# Patient Record
Sex: Female | Born: 1955 | Race: White | Hispanic: No | Smoking: Former smoker
Health system: Southern US, Community
[De-identification: ages and names within clinical notes are randomized; demographics above are authoritative.]

## PROBLEM LIST (undated history)

## (undated) DIAGNOSIS — T7840XA Allergy, unspecified, initial encounter: Secondary | ICD-10-CM

## (undated) DIAGNOSIS — E039 Hypothyroidism, unspecified: Secondary | ICD-10-CM

## (undated) DIAGNOSIS — Z6838 Body mass index (BMI) 38.0-38.9, adult: Secondary | ICD-10-CM

## (undated) DIAGNOSIS — D649 Anemia, unspecified: Secondary | ICD-10-CM

## (undated) HISTORY — DX: Body mass index (BMI) 38.0-38.9, adult: Z68.38

## (undated) HISTORY — PX: EYE SURGERY: SHX253

## (undated) HISTORY — PX: TONSILLECTOMY: SUR1361

## (undated) HISTORY — PX: APPENDECTOMY: SHX54

## (undated) HISTORY — PX: TUBAL LIGATION: SHX77

## (undated) HISTORY — DX: Allergy, unspecified, initial encounter: T78.40XA

## (undated) HISTORY — PX: CERVICAL CONE BIOPSY: SUR198

---

## 2012-02-01 ENCOUNTER — Encounter (INDEPENDENT_AMBULATORY_CARE_PROVIDER_SITE_OTHER): Payer: BC Managed Care – PPO | Admitting: Ophthalmology

## 2012-02-01 DIAGNOSIS — H43819 Vitreous degeneration, unspecified eye: Secondary | ICD-10-CM

## 2012-02-01 DIAGNOSIS — H2510 Age-related nuclear cataract, unspecified eye: Secondary | ICD-10-CM

## 2012-02-01 DIAGNOSIS — H35349 Macular cyst, hole, or pseudohole, unspecified eye: Secondary | ICD-10-CM

## 2012-02-02 ENCOUNTER — Other Ambulatory Visit (INDEPENDENT_AMBULATORY_CARE_PROVIDER_SITE_OTHER): Payer: Self-pay | Admitting: Ophthalmology

## 2012-02-02 DIAGNOSIS — H35349 Macular cyst, hole, or pseudohole, unspecified eye: Secondary | ICD-10-CM

## 2012-02-02 NOTE — H&P (Signed)
Taylor Berg is an 56 y.o. female.   Chief Complaint:Loss of vision right eye  HPI: Loss of vision right eye 3 months ago  Med history:  Negative for MI,  Negative for HIV and Hep  Surg Hx  Appendectomy at age 66 [facility] biopsy 2007   No family history on file. Social History:  does not have a smoking history on file. She does not have any smokeless tobacco history on file. Her alcohol and drug histories not on file.  Allergies: Allergies not on file  No current facility-administered medications on file as of .   No current outpatient prescriptions on file as of .    Review of systems otherwise negative  There were no vitals taken for this visit.  Physical exam: Mental status: oriented x3. Eyes: See eye exam associated with this surgery on this date of service.  Scanned in by scanning center.  Look in media Ears, Nose, Throat: within normal limits Neck: Within Normal limits General: within normal limits Chest: Within normal limits Breast: deferred Heart: Within normal limits Abdomen: Within normal limits GU: deferred Extremities: within normal limits Skin: within normal limits  Assessment/Plan Macular hole right eye Plan: To [facility] for pars plana vitrectomy with laser, gas and serum patch Right eye  Taylor Berg, Taylor Berg 02/02/2012, 8:03 AM

## 2012-02-03 ENCOUNTER — Encounter (HOSPITAL_COMMUNITY): Payer: Self-pay | Admitting: Pharmacy Technician

## 2012-02-03 ENCOUNTER — Other Ambulatory Visit (INDEPENDENT_AMBULATORY_CARE_PROVIDER_SITE_OTHER): Payer: Self-pay | Admitting: Ophthalmology

## 2012-02-15 ENCOUNTER — Inpatient Hospital Stay (INDEPENDENT_AMBULATORY_CARE_PROVIDER_SITE_OTHER): Payer: BC Managed Care – PPO | Admitting: Ophthalmology

## 2012-02-15 ENCOUNTER — Encounter (HOSPITAL_COMMUNITY): Payer: Self-pay

## 2012-02-15 MED ORDER — GATIFLOXACIN 0.5 % OP SOLN
1.0000 [drp] | OPHTHALMIC | Status: AC | PRN
  Administered 2012-02-16 (×3): 1 [drp] via OPHTHALMIC
  Filled 2012-02-15: qty 2.5

## 2012-02-15 MED ORDER — CYCLOPENTOLATE HCL 1 % OP SOLN
1.0000 [drp] | OPHTHALMIC | Status: AC | PRN
  Administered 2012-02-16 (×3): 1 [drp] via OPHTHALMIC
  Filled 2012-02-15: qty 2

## 2012-02-15 MED ORDER — PHENYLEPHRINE HCL 2.5 % OP SOLN
1.0000 [drp] | OPHTHALMIC | Status: AC | PRN
  Administered 2012-02-16 (×3): 1 [drp] via OPHTHALMIC
  Filled 2012-02-15: qty 3

## 2012-02-15 MED ORDER — TROPICAMIDE 1 % OP SOLN
1.0000 [drp] | OPHTHALMIC | Status: AC | PRN
  Administered 2012-02-16 (×3): 1 [drp] via OPHTHALMIC
  Filled 2012-02-15: qty 3

## 2012-02-15 MED ORDER — CLINDAMYCIN PHOSPHATE 600 MG/50ML IV SOLN
600.0000 mg | INTRAVENOUS | Status: AC
  Administered 2012-02-16: 600 mg via INTRAVENOUS
  Filled 2012-02-15: qty 50

## 2012-02-16 ENCOUNTER — Encounter (HOSPITAL_COMMUNITY): Payer: Self-pay | Admitting: *Deleted

## 2012-02-16 ENCOUNTER — Other Ambulatory Visit (HOSPITAL_COMMUNITY): Payer: Self-pay | Admitting: Ophthalmology

## 2012-02-16 ENCOUNTER — Encounter (HOSPITAL_COMMUNITY): Payer: Self-pay | Admitting: Anesthesiology

## 2012-02-16 ENCOUNTER — Ambulatory Visit (HOSPITAL_COMMUNITY)
Admission: RE | Admit: 2012-02-16 | Discharge: 2012-02-17 | Disposition: A | Discharge status: Home / Self Care | Payer: BC Managed Care – PPO | Source: Ambulatory Visit | Attending: Ophthalmology | Admitting: Ophthalmology

## 2012-02-16 ENCOUNTER — Ambulatory Visit (HOSPITAL_COMMUNITY): Payer: BC Managed Care – PPO

## 2012-02-16 ENCOUNTER — Ambulatory Visit (HOSPITAL_COMMUNITY): Payer: BC Managed Care – PPO | Admitting: Anesthesiology

## 2012-02-16 ENCOUNTER — Encounter (HOSPITAL_COMMUNITY)
Admission: RE | Disposition: A | Discharge status: Home / Self Care | Payer: Self-pay | Source: Ambulatory Visit | Attending: Ophthalmology

## 2012-02-16 DIAGNOSIS — H35349 Macular cyst, hole, or pseudohole, unspecified eye: Secondary | ICD-10-CM | POA: Insufficient documentation

## 2012-02-16 DIAGNOSIS — E039 Hypothyroidism, unspecified: Secondary | ICD-10-CM | POA: Insufficient documentation

## 2012-02-16 DIAGNOSIS — H33309 Unspecified retinal break, unspecified eye: Secondary | ICD-10-CM | POA: Insufficient documentation

## 2012-02-16 DIAGNOSIS — Q143 Congenital malformation of choroid: Secondary | ICD-10-CM | POA: Insufficient documentation

## 2012-02-16 HISTORY — DX: Hypothyroidism, unspecified: E03.9

## 2012-02-16 HISTORY — DX: Anemia, unspecified: D64.9

## 2012-02-16 HISTORY — PX: GAS INSERTION: SHX5336

## 2012-02-16 HISTORY — PX: PARS PLANA VITRECTOMY W/ SCLERAL BUCKLE: SHX2171

## 2012-02-16 LAB — CBC
HCT: 39.6 % (ref 36.0–46.0)
Hemoglobin: 13.5 g/dL (ref 12.0–15.0)
MCH: 30.8 pg (ref 26.0–34.0)
MCHC: 34.1 g/dL (ref 30.0–36.0)

## 2012-02-16 LAB — AUTOLOGOUS SERUM PATCH PREP

## 2012-02-16 IMAGING — CR DG CHEST 1V PORT
1 series · 1 of 1 positions shown · non-contrast
Comparison: None.

CLINICAL DATA: Central line placement.

PORTABLE CHEST - 1 VIEW

[view not recorded]
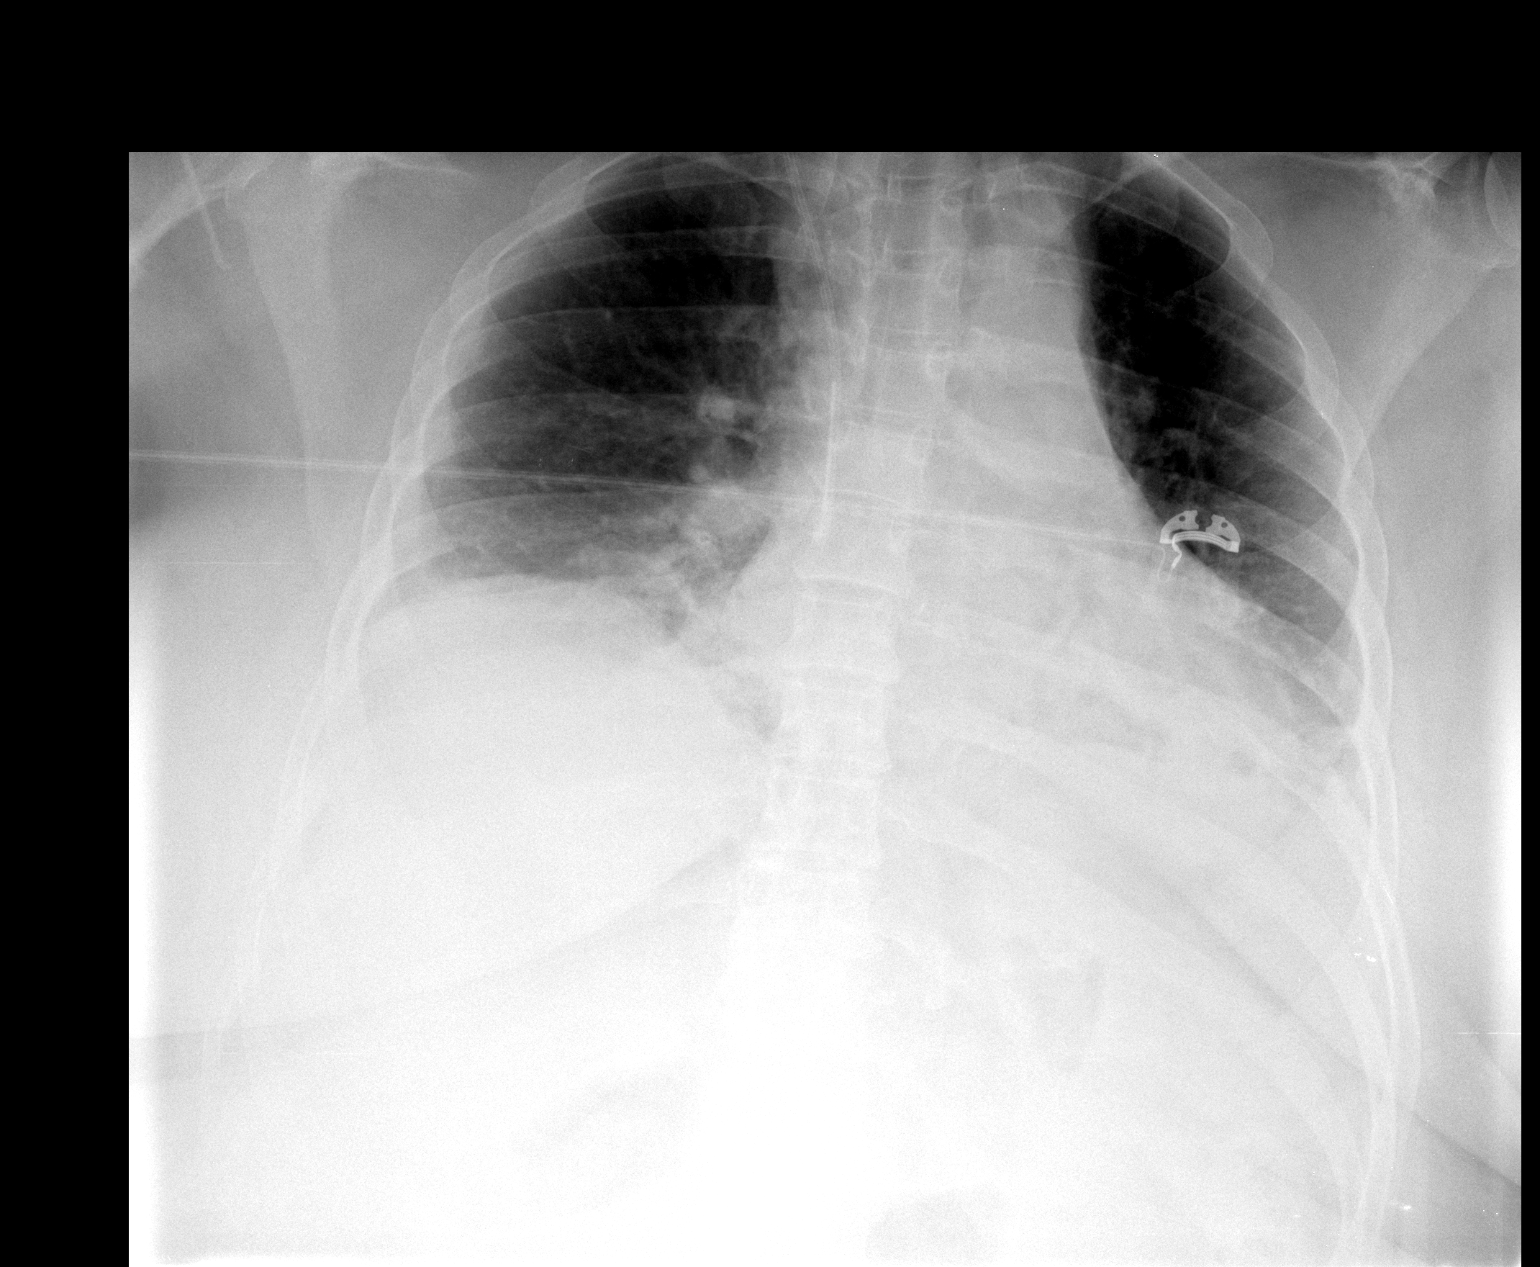

[1 of 1 positions shown; findings below may reference images not displayed]

FINDINGS: The right jugular center venous catheter is seen with tip
overlying expected level of the cavoatrial junction.  There is no
evidence of pneumothorax.

Low lung volumes are seen with bibasilar atelectasis.  Heart size
is within normal limits allowing for low lung volumes.  No definite
evidence of pleural effusion.
IMPRESSION: 1.  Right jugular center venous catheter tip overlies the expected
level of the cavoatrial junction.  No evidence of pneumothorax.
2.  Low lung volumes and bibasilar atelectasis.

## 2012-02-16 SURGERY — PARS PLANA VITRECTOMY WITH LASER FOR MACULAR HOLE
Anesthesia: General | Site: Eye | Laterality: Right | Wound class: Clean

## 2012-02-16 MED ORDER — SODIUM CHLORIDE 0.45 % IV SOLN: INTRAVENOUS | Status: DC

## 2012-02-16 MED ORDER — PROVISC 10 MG/ML IO SOLN
INTRAOCULAR | Status: DC | PRN
  Administered 2012-02-16: .85 mL via INTRAOCULAR

## 2012-02-16 MED ORDER — HYDROMORPHONE HCL PF 1 MG/ML IJ SOLN: 0.2500 mg | INTRAMUSCULAR | Status: DC | PRN

## 2012-02-16 MED ORDER — BSS PLUS IO SOLN
INTRAOCULAR | Status: DC | PRN
  Administered 2012-02-16: 1 via INTRAOCULAR

## 2012-02-16 MED ORDER — ONDANSETRON HCL 4 MG/2ML IJ SOLN: 4.0000 mg | Freq: Once | INTRAMUSCULAR | Status: DC | PRN

## 2012-02-16 MED ORDER — SODIUM CHLORIDE 0.9 % IV SOLN
INTRAVENOUS | Status: DC | PRN
  Administered 2012-02-16 (×3): via INTRAVENOUS

## 2012-02-16 MED ORDER — MUPIROCIN 2 % EX OINT
TOPICAL_OINTMENT | CUTANEOUS | Status: AC
  Administered 2012-02-16: 1 via NASAL
  Filled 2012-02-16: qty 22

## 2012-02-16 MED ORDER — ONDANSETRON HCL 4 MG/2ML IJ SOLN: 4.0000 mg | Freq: Four times a day (QID) | INTRAMUSCULAR | Status: DC | PRN

## 2012-02-16 MED ORDER — EPINEPHRINE HCL 0.1 MG/ML IJ SOLN
INTRAMUSCULAR | Status: DC | PRN
  Administered 2012-02-16: 0.3 mg

## 2012-02-16 MED ORDER — ONDANSETRON HCL 4 MG/2ML IJ SOLN
INTRAMUSCULAR | Status: DC | PRN
  Administered 2012-02-16: 4 mg via INTRAVENOUS

## 2012-02-16 MED ORDER — 0.9 % SODIUM CHLORIDE (POUR BTL) OPTIME
TOPICAL | Status: DC | PRN
  Administered 2012-02-16: 1000 mL

## 2012-02-16 MED ORDER — ACETAMINOPHEN 325 MG PO TABS: 325.0000 mg | ORAL_TABLET | ORAL | Status: DC | PRN

## 2012-02-16 MED ORDER — BSS IO SOLN
INTRAOCULAR | Status: DC | PRN
  Administered 2012-02-16: 15 mL via INTRAOCULAR

## 2012-02-16 MED ORDER — BUPIVACAINE HCL 0.75 % IJ SOLN
INTRAMUSCULAR | Status: DC | PRN
  Administered 2012-02-16: 10 mL

## 2012-02-16 MED ORDER — OXYCODONE-ACETAMINOPHEN 5-325 MG PO TABS: 1.0000 | ORAL_TABLET | ORAL | Status: DC | PRN

## 2012-02-16 MED ORDER — BACITRACIN-POLYMYXIN B 500-10000 UNIT/GM OP OINT: TOPICAL_OINTMENT | Freq: Two times a day (BID) | OPHTHALMIC | Status: AC

## 2012-02-16 MED ORDER — PREDNISOLONE ACETATE 1 % OP SUSP
1.0000 [drp] | Freq: Four times a day (QID) | OPHTHALMIC | Status: DC
  Filled 2012-02-16: qty 1

## 2012-02-16 MED ORDER — LATANOPROST 0.005 % OP SOLN
1.0000 [drp] | Freq: Every day | OPHTHALMIC | Status: DC
  Filled 2012-02-16: qty 2.5

## 2012-02-16 MED ORDER — TEMAZEPAM 15 MG PO CAPS: 15.0000 mg | ORAL_CAPSULE | Freq: Every evening | ORAL | Status: DC | PRN

## 2012-02-16 MED ORDER — ROCURONIUM BROMIDE 100 MG/10ML IV SOLN
INTRAVENOUS | Status: DC | PRN
  Administered 2012-02-16: 50 mg via INTRAVENOUS

## 2012-02-16 MED ORDER — GATIFLOXACIN 0.5 % OP SOLN
1.0000 [drp] | Freq: Four times a day (QID) | OPHTHALMIC | Status: DC
  Filled 2012-02-16: qty 2.5

## 2012-02-16 MED ORDER — MORPHINE SULFATE 2 MG/ML IJ SOLN: 1.0000 mg | INTRAMUSCULAR | Status: DC | PRN

## 2012-02-16 MED ORDER — ACETAZOLAMIDE SODIUM 500 MG IJ SOLR
500.0000 mg | Freq: Once | INTRAMUSCULAR | Status: AC
  Administered 2012-02-17: 500 mg via INTRAVENOUS
  Filled 2012-02-16: qty 500

## 2012-02-16 MED ORDER — PROPOFOL 10 MG/ML IV EMUL
INTRAVENOUS | Status: DC | PRN
  Administered 2012-02-16: 170 mg via INTRAVENOUS

## 2012-02-16 MED ORDER — GLYCOPYRROLATE 0.2 MG/ML IJ SOLN
INTRAMUSCULAR | Status: DC | PRN
  Administered 2012-02-16: 1 mg via INTRAVENOUS

## 2012-02-16 MED ORDER — DEXAMETHASONE SODIUM PHOSPHATE 10 MG/ML IJ SOLN
INTRAMUSCULAR | Status: DC | PRN
  Administered 2012-02-16: 10 mg

## 2012-02-16 MED ORDER — TETRACAINE HCL 0.5 % OP SOLN
2.0000 [drp] | Freq: Once | OPHTHALMIC | Status: DC
  Filled 2012-02-16: qty 2

## 2012-02-16 MED ORDER — FENTANYL CITRATE 0.05 MG/ML IJ SOLN
INTRAMUSCULAR | Status: DC | PRN
Start: 2012-02-16 — End: 2012-02-16
  Administered 2012-02-16: 150 ug via INTRAVENOUS

## 2012-02-16 MED ORDER — BRIMONIDINE TARTRATE 0.2 % OP SOLN
1.0000 [drp] | Freq: Two times a day (BID) | OPHTHALMIC | Status: DC
  Filled 2012-02-16: qty 5

## 2012-02-16 MED ORDER — MIDAZOLAM HCL 5 MG/5ML IJ SOLN
INTRAMUSCULAR | Status: DC | PRN
  Administered 2012-02-16: 2 mg via INTRAVENOUS

## 2012-02-16 MED ORDER — NEOSTIGMINE METHYLSULFATE 1 MG/ML IJ SOLN
INTRAMUSCULAR | Status: DC | PRN
  Administered 2012-02-16: 5 mg via INTRAVENOUS

## 2012-02-16 MED ORDER — MAGNESIUM HYDROXIDE 400 MG/5ML PO SUSP: 15.0000 mL | Freq: Four times a day (QID) | ORAL | Status: DC | PRN

## 2012-02-16 MED ORDER — BACITRACIN-POLYMYXIN B 500-10000 UNIT/GM OP OINT
TOPICAL_OINTMENT | OPHTHALMIC | Status: DC | PRN
  Administered 2012-02-16: 1 via OPHTHALMIC

## 2012-02-16 MED ORDER — HEMOSTATIC AGENTS (NO CHARGE) OPTIME
TOPICAL | Status: DC | PRN
  Administered 2012-02-16: 1

## 2012-02-16 MED ORDER — ATROPINE SULFATE 1 % OP SOLN
OPHTHALMIC | Status: DC | PRN
  Administered 2012-02-16: 1 [drp] via OPHTHALMIC

## 2012-02-16 MED ORDER — SODIUM CHLORIDE 0.9 % IJ SOLN
INTRAMUSCULAR | Status: DC | PRN
  Administered 2012-02-16: 13:00:00

## 2012-02-16 MED ORDER — MUPIROCIN 2 % EX OINT
TOPICAL_OINTMENT | Freq: Two times a day (BID) | CUTANEOUS | Status: DC
  Administered 2012-02-16: 1 via NASAL
  Filled 2012-02-16: qty 22

## 2012-02-16 SURGICAL SUPPLY — 58 items
ACCESSORY FRAGMATOME (MISCELLANEOUS) IMPLANT
APPLICATOR DR MATTHEWS STRL (MISCELLANEOUS) IMPLANT
BLADE EYE CATARACT 19 1.4 BEAV (BLADE) IMPLANT
BLADE MVR KNIFE 19G (BLADE) ×2 IMPLANT
CANNULA ANT CHAM MAIN (OPHTHALMIC RELATED) IMPLANT
CANNULA FLEX TIP 25G (CANNULA) ×2 IMPLANT
CANNULA SUBRETINAL FLUID 20G (BLADE) ×2 IMPLANT
CLOTH BEACON ORANGE TIMEOUT ST (SAFETY) ×2 IMPLANT
CORDS BIPOLAR (ELECTRODE) ×2 IMPLANT
COTTONBALL LRG STERILE PKG (GAUZE/BANDAGES/DRESSINGS) ×6 IMPLANT
COVER MAYO STAND STRL (DRAPES) IMPLANT
DRAPE OPHTHALMIC 77X100 STRL (CUSTOM PROCEDURE TRAY) ×2 IMPLANT
EAGLE VIT/RET MICRO PIC 168 25 (MISCELLANEOUS) IMPLANT
ERASER HMR WETFIELD 23G BP (MISCELLANEOUS) IMPLANT
FILTER BLUE MILLIPORE (MISCELLANEOUS) ×2 IMPLANT
FILTER STRAW FLUID ASPIR (MISCELLANEOUS) ×2 IMPLANT
GAS OPHTHALMIC (MISCELLANEOUS) ×2 IMPLANT
GLOVE ECLIPSE 6.5 STRL STRAW (GLOVE) ×2 IMPLANT
GLOVE SS BIOGEL STRL SZ 7 (GLOVE) ×1 IMPLANT
GLOVE SUPERSENSE BIOGEL SZ 7 (GLOVE) ×1
GLOVE SURG 8.5 LATEX PF (GLOVE) ×4 IMPLANT
GOWN STRL NON-REIN LRG LVL3 (GOWN DISPOSABLE) ×6 IMPLANT
KIT ROOM TURNOVER OR (KITS) ×2 IMPLANT
KNIFE CRESCENT 1.75 EDGEAHEAD (BLADE) ×2 IMPLANT
KNIFE GRIESHABER SHARP 2.5MM (MISCELLANEOUS) IMPLANT
LIGHT CONNECTOR 25GA (OPHTHALMIC RELATED) ×2 IMPLANT
MARKER SKIN DUAL TIP RULER LAB (MISCELLANEOUS) ×2 IMPLANT
MASK EYE SHIELD (GAUZE/BANDAGES/DRESSINGS) ×2 IMPLANT
NEEDLE 18GX1X1/2 (RX/OR ONLY) (NEEDLE) ×2 IMPLANT
NEEDLE 25GX 5/8IN NON SAFETY (NEEDLE) ×2 IMPLANT
NEEDLE 27GAX1X1/2 (NEEDLE) IMPLANT
NEEDLE HYPO 30X.5 LL (NEEDLE) ×4 IMPLANT
NS IRRIG 1000ML POUR BTL (IV SOLUTION) ×2 IMPLANT
PACK VITRECTOMY CUSTOM (CUSTOM PROCEDURE TRAY) ×2 IMPLANT
PACK VITRECTOMY PIC MCHSVP (PACKS) ×2 IMPLANT
PAD ARMBOARD 7.5X6 YLW CONV (MISCELLANEOUS) ×4 IMPLANT
PAD EYE OVAL STERILE LF (GAUZE/BANDAGES/DRESSINGS) ×2 IMPLANT
PROBE LASER LIGHT 20GA (MISCELLANEOUS) IMPLANT
REPL STRA BRUSH NEEDLE (NEEDLE) ×2 IMPLANT
RESERVOIR BACK FLUSH (MISCELLANEOUS) ×2 IMPLANT
ROLLS DENTAL (MISCELLANEOUS) ×4 IMPLANT
SCRAPER DIAMOND DUST MEMBRANE (MISCELLANEOUS) ×2 IMPLANT
SPONGE SURGIFOAM ABS GEL 12-7 (HEMOSTASIS) ×2 IMPLANT
STOPCOCK 4 WAY LG BORE MALE ST (IV SETS) IMPLANT
SUT CHROMIC 7 0 TG140 8 (SUTURE) IMPLANT
SUT ETHILON 9 0 TG140 8 (SUTURE) ×2 IMPLANT
SUT POLY NON ABSORB 10-0 8 STR (SUTURE) IMPLANT
SUT SILK 4 0 RB 1 (SUTURE) IMPLANT
SWAB COLLECTION DEVICE MRSA (MISCELLANEOUS) IMPLANT
SYR 20CC LL (SYRINGE) ×2 IMPLANT
SYR 5ML LL (SYRINGE) IMPLANT
SYR BULB 3OZ (MISCELLANEOUS) ×2 IMPLANT
SYR TB 1ML LUER SLIP (SYRINGE) ×2 IMPLANT
SYRINGE 10CC LL (SYRINGE) ×2 IMPLANT
TOWEL OR 17X24 6PK STRL BLUE (TOWEL DISPOSABLE) ×6 IMPLANT
TUBE ANAEROBIC SPECIMEN COL (MISCELLANEOUS) IMPLANT
WATER STERILE IRR 1000ML POUR (IV SOLUTION) ×2 IMPLANT
WIPE INSTRUMENT VISIWIPE 73X73 (MISCELLANEOUS) ×2 IMPLANT

## 2012-02-16 NOTE — Anesthesia Procedure Notes (Signed)
Procedure Name: Intubation Date/Time: 02/16/2012 12:23 PM Performed by: Margaree Mackintosh Pre-anesthesia Checklist: Patient identified, Timeout performed, Suction available, Emergency Drugs available and Patient being monitored Patient Re-evaluated:Patient Re-evaluated prior to inductionOxygen Delivery Method: Circle system utilized Preoxygenation: Pre-oxygenation with 100% oxygen Intubation Type: IV induction Ventilation: Mask ventilation without difficulty and Oral airway inserted - appropriate to patient size Laryngoscope Size: Mac and 3 Grade View: Grade II Tube type: Oral Tube size: 7.5 mm Number of attempts: 1 Airway Equipment and Method: Stylet Placement Confirmation: ETT inserted through vocal cords under direct vision,  breath sounds checked- equal and bilateral and positive ETCO2 Secured at: 22 cm Tube secured with: Tape Dental Injury: Teeth and Oropharynx as per pre-operative assessment

## 2012-02-16 NOTE — Brief Op Note (Signed)
Brief Operative note   Preoperative diagnosis:  Pre-Op Diagnosis Codes:    * Macular cyst, hole, or pseudohole of retina [362.54] Postoperative diagnosis  Post-Op Diagnosis Codes:    * Macular cyst, hole, or pseudohole of retina [362.54]  Procedures: Pars plana vitrectomy right eye  Surgeon:  Sherrie George, MD...  Assistant:  Rosalie Doctor SA   Anesthesia: General  Specimen: none  Estimated blood loss:  1cc  Complications: none  Patient sent to PACU in good condition  Composed by Sherrie George MD  Dictation number: [id number]

## 2012-02-16 NOTE — Anesthesia Preprocedure Evaluation (Addendum)
Anesthesia Evaluation  Patient identified by MRN, date of birth, ID band Patient awake    Reviewed: Allergy & Precautions, H&P , NPO status , Patient's Chart, lab work & pertinent test results, reviewed documented beta blocker date and time   Airway Mallampati: II TM Distance: >3 FB Neck ROM: Full    Dental  (+) Teeth Intact and Dental Advisory Given   Pulmonary  clear to auscultation        Cardiovascular neg cardio ROS Regular Normal    Neuro/Psych    GI/Hepatic   Endo/Other  Hypothyroidism Morbid obesity  Renal/GU      Musculoskeletal   Abdominal (+) obese,   Peds  Hematology   Anesthesia Other Findings   Reproductive/Obstetrics                          Anesthesia Physical Anesthesia Plan  ASA: III  Anesthesia Plan: General   Post-op Pain Management:    Induction: Intravenous  Airway Management Planned: Oral ETT  Additional Equipment:   Intra-op Plan:   Post-operative Plan: Extubation in OR  Informed Consent: I have reviewed the patients History and Physical, chart, labs and discussed the procedure including the risks, benefits and alternatives for the proposed anesthesia with the patient or authorized representative who has indicated his/her understanding and acceptance.   Dental advisory given  Plan Discussed with: CRNA and Anesthesiologist  Anesthesia Plan Comments: (Obesity Hypothyroidism R. Macular hole  Plan GA with ETT  Kipp Brood, MD)       Anesthesia Quick Evaluation

## 2012-02-16 NOTE — H&P (Signed)
I examined the patient today and there is no change in the medical status 

## 2012-02-16 NOTE — Anesthesia Postprocedure Evaluation (Signed)
  Anesthesia Post-op Note  Patient: Taylor Berg  Procedure(s) Performed: Procedure(s) (LRB): PARS PLANA VITRECTOMY WITH LASER FOR MACULAR HOLE (Right) SERUM PATCH (Right) MEMBRANE PEEL (Right) INSERTION OF GAS (Right)  Patient Location: PACU  Anesthesia Type: General  Level of Consciousness: awake and alert   Airway and Oxygen Therapy: Patient Spontanous Breathing and Patient connected to nasal cannula oxygen  Post-op Pain: none  Post-op Assessment: Post-op Vital signs reviewed, Patient's Cardiovascular Status Stable, Respiratory Function Stable, Patent Airway, No signs of Nausea or vomiting and Pain level controlled  Post-op Vital Signs: Reviewed and stable  Complications: No apparent anesthesia complications

## 2012-02-16 NOTE — Transfer of Care (Signed)
Immediate Anesthesia Transfer of Care Note  Patient: Taylor Berg  Procedure(s) Performed: Procedure(s) (LRB): PARS PLANA VITRECTOMY WITH LASER FOR MACULAR HOLE (Right) SERUM PATCH (Right) MEMBRANE PEEL (Right) INSERTION OF GAS (Right)  Patient Location: PACU  Anesthesia Type: General  Level of Consciousness: awake and alert   Airway & Oxygen Therapy: Patient Spontanous Breathing and Patient connected to nasal cannula oxygen  Post-op Assessment: Report given to PACU RN and Post -op Vital signs reviewed and stable  Post vital signs: Reviewed  Complications: No apparent anesthesia complications

## 2012-02-16 NOTE — Preoperative (Signed)
Beta Blockers   Reason not to administer Beta Blockers:Not Applicable. No home beta blockers 

## 2012-02-17 MED ORDER — GATIFLOXACIN 0.5 % OP SOLN: 1.0000 [drp] | Freq: Four times a day (QID) | OPHTHALMIC | Status: DC

## 2012-02-17 MED ORDER — PREDNISOLONE ACETATE 1 % OP SUSP: 1.0000 [drp] | Freq: Four times a day (QID) | OPHTHALMIC | Status: AC

## 2012-02-17 MED ORDER — BACITRACIN-POLYMYXIN B 500-10000 UNIT/GM OP OINT
TOPICAL_OINTMENT | Freq: Three times a day (TID) | OPHTHALMIC | Status: DC
  Filled 2012-02-17: qty 3.5

## 2012-02-17 MED ORDER — HYDROCODONE-ACETAMINOPHEN 5-500 MG PO TABS: 1.0000 | ORAL_TABLET | Freq: Four times a day (QID) | ORAL | Status: AC | PRN

## 2012-02-17 NOTE — Progress Notes (Signed)
02/17/2012, 6:58 AM  Mental Status:  Awake, Alert, Oriented  Anterior segment: Cornea  Clear    Anterior Chamber Clear    Lens:   Clear  Intra Ocular Pressure 15 mmHg with Tonopen  Vitreous: Clear 100 %gas bubble   Retina:  Attached Good laser reaction   Impression: Excellent result Retina attached Poor view  Final Diagnosis: Active Problems:  Macular Hole   Plan: start post operative eye drops.  Discharge to home.  Give post operative instructions  Sherrie George 02/17/2012, 6:58 AM

## 2012-02-17 NOTE — Op Note (Signed)
NAMEPLESHETTE, Taylor Berg                ACCOUNT NO.:  [id number]  MEDICAL RECORD NO.:  [id number]  LOCATION:  5128                         FACILITY:  [facility]  PHYSICIAN:  Beulah Gandy. Ashley Royalty, M.D. DATE OF BIRTH:  21-May-1956  DATE OF PROCEDURE: DATE OF DISCHARGE:                              OPERATIVE REPORT   ADMISSION DIAGNOSIS:  Macular hole, right eye, also congenital retinal pigment epithelial hypertrophy, right eye and also retinal break, right eye.  PROCEDURES:  Pars plana vitrectomy with membrane peel, retinal photocoagulation, gas fluid exchange, serum patch, all in the in the right eye.  SURGEON:  Beulah Gandy. Ashley Royalty, MD  ASSISTANT:  Rosalie Doctor, MA, SA.  ANESTHESIA:  General.  DETAILS:  With usual prep and drape, the indirect ophthalmoscope laser was moved into place and 631 laser burns were placed around the retinal periphery and around a retinal break at 8 o'clock in the anterior to a CHRPE at 10 o'clock.  The attention was then carried to the pars plana area where 25-gauge trocars were placed at 8, 10, and 2 o'clock. Contact lens ring was anchored into place at 6 and 12 o'clock.  Provisc was placed on the corneal surface and the flat contact lens was placed. Pars plana vitrectomy was begun just behind the crystalline lens. Membranes were encountered and carefully removed with the vitreous cutter at high speed.  The vitrectomy was carried down to the macular surface in a core fashion.  It was apparent that the macular hole had a rolled edge and there were no staples in the pit of the macular hole. The vitrectomy carried into the mid periphery and all vitreous was removed from the mid peripheral area.  Vitrectomy was trimmed down to the vitreous base.  The diamond-dusted membrane scraper was used to engage the internal limiting membrane and free from its attachments to the edge of the hole and for approximately at this time from around the hole.  The internal limiting  membrane was removed.  The suction cutter was used to remove the membrane after was freed from the retinal surface.  Several small pinpoint hemorrhages occurred in the payroll macular area.  A gas fluid exchange was performed.  Sufficient time was allowed for additional fluid to track down the walls of the eye and collect in the posterior segment.  The double wide contact lens was used.  Serum patch was prepared, perfluoropropane in a 16% concentration was prepared.  Additional fluid was removed from the posterior pole and from the pit of the hole.  Serum patch was delivered.  The serum patch was partially removed with the suction cutter until the macular region was dry.  C3F8 in 16% concentration was then exchanged for intravitreal gas.  Instruments and trocars were removed, as the gas was exchanged for intravitreal air.  The wounds were tested and found to be tight.  The contact lens ring was removed.  The trocars were removed.  Polymyxin and gentamicin were irrigated into [place] space.  Atropine solution was applied.  Marcaine was injected around the globe for postop pain. Decadron 10 mg was injected to the lower subconjunctival space. Polysporin ophthalmic ointment, a patch and shield were placed,  closing pressure was 10 with a [place] tonometer.  COMPLICATIONS:  None.  DURATION:  One hour.  The patient was awakened and taken to recovery in satisfactory condition.     Beulah Gandy. Ashley Royalty, M.D.     JDM/MEDQ  D:  02/16/2012  T:  02/17/2012  Job:  [id number]

## 2012-02-17 NOTE — Progress Notes (Signed)
DC home with friend. DC instructions given to pt and explained by Dr. Ashley Royalty. Pt states she understands. instructi0ns.

## 2012-02-17 NOTE — Discharge Summary (Signed)
Discharge summary not needed on [facility] patients per medical records. 

## 2012-02-20 ENCOUNTER — Encounter (HOSPITAL_COMMUNITY): Payer: Self-pay | Admitting: Ophthalmology

## 2012-02-22 ENCOUNTER — Inpatient Hospital Stay (INDEPENDENT_AMBULATORY_CARE_PROVIDER_SITE_OTHER): Payer: BC Managed Care – PPO | Admitting: Ophthalmology

## 2012-02-22 DIAGNOSIS — H35369 Drusen (degenerative) of macula, unspecified eye: Secondary | ICD-10-CM

## 2012-03-18 ENCOUNTER — Ambulatory Visit (INDEPENDENT_AMBULATORY_CARE_PROVIDER_SITE_OTHER): Payer: BC Managed Care – PPO | Admitting: Ophthalmology

## 2012-03-18 DIAGNOSIS — H35349 Macular cyst, hole, or pseudohole, unspecified eye: Secondary | ICD-10-CM

## 2012-05-30 ENCOUNTER — Encounter (INDEPENDENT_AMBULATORY_CARE_PROVIDER_SITE_OTHER): Payer: BC Managed Care – PPO | Admitting: Ophthalmology

## 2012-05-30 DIAGNOSIS — H43819 Vitreous degeneration, unspecified eye: Secondary | ICD-10-CM

## 2012-05-30 DIAGNOSIS — H35349 Macular cyst, hole, or pseudohole, unspecified eye: Secondary | ICD-10-CM

## 2012-05-30 DIAGNOSIS — H2510 Age-related nuclear cataract, unspecified eye: Secondary | ICD-10-CM

## 2012-05-30 DIAGNOSIS — Q143 Congenital malformation of choroid: Secondary | ICD-10-CM

## 2012-09-30 ENCOUNTER — Ambulatory Visit (INDEPENDENT_AMBULATORY_CARE_PROVIDER_SITE_OTHER): Payer: BC Managed Care – PPO | Admitting: Ophthalmology

## 2012-09-30 DIAGNOSIS — H35349 Macular cyst, hole, or pseudohole, unspecified eye: Secondary | ICD-10-CM

## 2012-09-30 DIAGNOSIS — H2510 Age-related nuclear cataract, unspecified eye: Secondary | ICD-10-CM

## 2012-09-30 DIAGNOSIS — H43819 Vitreous degeneration, unspecified eye: Secondary | ICD-10-CM

## 2012-09-30 DIAGNOSIS — Q143 Congenital malformation of choroid: Secondary | ICD-10-CM

## 2013-03-31 ENCOUNTER — Ambulatory Visit (INDEPENDENT_AMBULATORY_CARE_PROVIDER_SITE_OTHER): Payer: BC Managed Care – PPO | Admitting: Ophthalmology

## 2013-03-31 DIAGNOSIS — H43819 Vitreous degeneration, unspecified eye: Secondary | ICD-10-CM

## 2013-03-31 DIAGNOSIS — H2510 Age-related nuclear cataract, unspecified eye: Secondary | ICD-10-CM

## 2013-03-31 DIAGNOSIS — H35349 Macular cyst, hole, or pseudohole, unspecified eye: Secondary | ICD-10-CM

## 2013-03-31 DIAGNOSIS — Q143 Congenital malformation of choroid: Secondary | ICD-10-CM

## 2014-04-11 ENCOUNTER — Ambulatory Visit (INDEPENDENT_AMBULATORY_CARE_PROVIDER_SITE_OTHER): Payer: BC Managed Care – PPO | Admitting: Ophthalmology

## 2014-05-09 ENCOUNTER — Ambulatory Visit (INDEPENDENT_AMBULATORY_CARE_PROVIDER_SITE_OTHER): Payer: BC Managed Care – PPO | Admitting: Ophthalmology

## 2014-05-30 ENCOUNTER — Ambulatory Visit (INDEPENDENT_AMBULATORY_CARE_PROVIDER_SITE_OTHER): Payer: BC Managed Care – PPO | Admitting: Ophthalmology

## 2014-05-30 DIAGNOSIS — H2510 Age-related nuclear cataract, unspecified eye: Secondary | ICD-10-CM

## 2014-05-30 DIAGNOSIS — H35379 Puckering of macula, unspecified eye: Secondary | ICD-10-CM

## 2014-05-30 DIAGNOSIS — H43819 Vitreous degeneration, unspecified eye: Secondary | ICD-10-CM

## 2014-05-30 DIAGNOSIS — H35349 Macular cyst, hole, or pseudohole, unspecified eye: Secondary | ICD-10-CM

## 2015-05-31 ENCOUNTER — Ambulatory Visit (INDEPENDENT_AMBULATORY_CARE_PROVIDER_SITE_OTHER): Payer: BLUE CROSS/BLUE SHIELD | Admitting: Ophthalmology

## 2015-05-31 DIAGNOSIS — H35341 Macular cyst, hole, or pseudohole, right eye: Secondary | ICD-10-CM

## 2015-05-31 DIAGNOSIS — D3131 Benign neoplasm of right choroid: Secondary | ICD-10-CM

## 2015-05-31 DIAGNOSIS — H43812 Vitreous degeneration, left eye: Secondary | ICD-10-CM

## 2015-05-31 DIAGNOSIS — H2512 Age-related nuclear cataract, left eye: Secondary | ICD-10-CM

## 2016-06-03 ENCOUNTER — Ambulatory Visit (INDEPENDENT_AMBULATORY_CARE_PROVIDER_SITE_OTHER): Payer: BLUE CROSS/BLUE SHIELD | Admitting: Ophthalmology

## 2016-09-04 ENCOUNTER — Ambulatory Visit (INDEPENDENT_AMBULATORY_CARE_PROVIDER_SITE_OTHER): Payer: BLUE CROSS/BLUE SHIELD | Admitting: Ophthalmology

## 2016-09-04 DIAGNOSIS — D3131 Benign neoplasm of right choroid: Secondary | ICD-10-CM

## 2016-09-04 DIAGNOSIS — H35341 Macular cyst, hole, or pseudohole, right eye: Secondary | ICD-10-CM | POA: Diagnosis not present

## 2016-09-04 DIAGNOSIS — H43812 Vitreous degeneration, left eye: Secondary | ICD-10-CM | POA: Diagnosis not present

## 2016-09-04 DIAGNOSIS — H353121 Nonexudative age-related macular degeneration, left eye, early dry stage: Secondary | ICD-10-CM

## 2017-09-10 ENCOUNTER — Ambulatory Visit (INDEPENDENT_AMBULATORY_CARE_PROVIDER_SITE_OTHER): Payer: BLUE CROSS/BLUE SHIELD | Admitting: Ophthalmology

## 2018-03-14 ENCOUNTER — Encounter (INDEPENDENT_AMBULATORY_CARE_PROVIDER_SITE_OTHER): Payer: BLUE CROSS/BLUE SHIELD | Admitting: Ophthalmology

## 2018-04-18 ENCOUNTER — Encounter (INDEPENDENT_AMBULATORY_CARE_PROVIDER_SITE_OTHER): Payer: BLUE CROSS/BLUE SHIELD | Admitting: Ophthalmology

## 2018-04-18 DIAGNOSIS — D3131 Benign neoplasm of right choroid: Secondary | ICD-10-CM

## 2018-04-18 DIAGNOSIS — H35341 Macular cyst, hole, or pseudohole, right eye: Secondary | ICD-10-CM | POA: Diagnosis not present

## 2018-04-18 DIAGNOSIS — H43812 Vitreous degeneration, left eye: Secondary | ICD-10-CM | POA: Diagnosis not present

## 2018-04-18 DIAGNOSIS — H2512 Age-related nuclear cataract, left eye: Secondary | ICD-10-CM | POA: Diagnosis not present

## 2019-04-20 ENCOUNTER — Encounter (INDEPENDENT_AMBULATORY_CARE_PROVIDER_SITE_OTHER): Payer: BLUE CROSS/BLUE SHIELD | Admitting: Ophthalmology

## 2019-05-11 ENCOUNTER — Encounter (INDEPENDENT_AMBULATORY_CARE_PROVIDER_SITE_OTHER): Payer: BLUE CROSS/BLUE SHIELD | Admitting: Ophthalmology

## 2019-09-18 ENCOUNTER — Encounter (INDEPENDENT_AMBULATORY_CARE_PROVIDER_SITE_OTHER): Payer: Self-pay | Admitting: Ophthalmology

## 2021-07-21 DIAGNOSIS — E079 Disorder of thyroid, unspecified: Secondary | ICD-10-CM | POA: Insufficient documentation

## 2021-12-08 HISTORY — PX: VAGINAL HYSTERECTOMY: SUR661

## 2021-12-09 DIAGNOSIS — D069 Carcinoma in situ of cervix, unspecified: Secondary | ICD-10-CM | POA: Insufficient documentation

## 2021-12-25 ENCOUNTER — Telehealth: Payer: Self-pay | Admitting: *Deleted

## 2021-12-25 NOTE — Telephone Encounter (Signed)
Spoke with the patient and scheduled the patient for a new patient on 1/9 at 11:15 am. Patient to arrival at 10:45 am. Patient given the address and phone number for the clinic, along with the policy for mask and visitors

## 2021-12-29 ENCOUNTER — Encounter: Payer: Self-pay | Admitting: Gynecologic Oncology

## 2021-12-29 ENCOUNTER — Inpatient Hospital Stay: Payer: Medicare Other | Attending: Gynecologic Oncology | Admitting: Gynecologic Oncology

## 2021-12-29 ENCOUNTER — Other Ambulatory Visit: Payer: Self-pay

## 2021-12-29 VITALS — BP 132/73 | HR 67 | Temp 99.2°F | Resp 18 | Ht 66.0 in | Wt 236.0 lb

## 2021-12-29 DIAGNOSIS — R21 Rash and other nonspecific skin eruption: Secondary | ICD-10-CM | POA: Insufficient documentation

## 2021-12-29 DIAGNOSIS — Z6839 Body mass index (BMI) 39.0-39.9, adult: Secondary | ICD-10-CM | POA: Insufficient documentation

## 2021-12-29 DIAGNOSIS — L309 Dermatitis, unspecified: Secondary | ICD-10-CM | POA: Insufficient documentation

## 2021-12-29 DIAGNOSIS — E039 Hypothyroidism, unspecified: Secondary | ICD-10-CM | POA: Diagnosis not present

## 2021-12-29 DIAGNOSIS — Z79899 Other long term (current) drug therapy: Secondary | ICD-10-CM | POA: Insufficient documentation

## 2021-12-29 DIAGNOSIS — C539 Malignant neoplasm of cervix uteri, unspecified: Secondary | ICD-10-CM | POA: Insufficient documentation

## 2021-12-29 DIAGNOSIS — Z6838 Body mass index (BMI) 38.0-38.9, adult: Secondary | ICD-10-CM | POA: Diagnosis not present

## 2021-12-29 DIAGNOSIS — Z780 Asymptomatic menopausal state: Secondary | ICD-10-CM | POA: Insufficient documentation

## 2021-12-29 MED ORDER — TRIAMCINOLONE ACETONIDE 0.025 % EX OINT: 1.0000 "application " | TOPICAL_OINTMENT | Freq: Two times a day (BID) | CUTANEOUS | 0 refills | Status: DC

## 2021-12-29 NOTE — Progress Notes (Signed)
GYNECOLOGIC ONCOLOGY NEW PATIENT CONSULTATION   Patient Name: Taylor Berg  Patient Age: 66 y.o. Date of Service: 12/29/2021 Referring Provider: Dr. 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  Patient is overall doing well from a postoperative standpoint and meeting milestones.  We discussed continued restrictions as well as postoperative expectations.  I do not appreciate any findings of residual cancer on her exam.  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  Pathology was reviewed at [facility].  Both initial pathology as well as second opinion find that depth of invasion is less than 3 mm.  Given small size of the tumor with this depth of invasion, she has stage IA1 disease.  No LVI was identified.  I have asked my office to reach out to the original pathologist to clarify comment regarding difficulty determining depth of invasion given inflammatory process around the tumor.  We will asked that a CPS be performed.  We reviewed guidelines that for a tumor of this size with no LVI, risk of nodal metastases is quite low and lymph node assessment is not warranted at the time of surgery.  The patient has had a postoperative CT scan with prominent 6 mm para-aortic lymph node and 9 mm pelvic lymph node.  Neither meet size criteria for being enlarged, but impression comment notes that 9 mm right external iliac node is prominent.  This may have been reactive in the setting of recent surgery.  I have recommended that we proceed with PET scan to better delineate concern for possible nodal involvement.  If PET scan is reassuring, we discussed proceeding with surveillance plan.  Per NCCN surveillance guidelines, surveillance visits would happen every 3-6 months for 2 years.  Plan would be for a yearly Pap test.  I  recommended that we begin with visits every 3 months for at least a year and then could transition to visits every 6 months.  We discussed signs and symptoms that would be concerning for cancer recurrence, and I have encouraged the patient to call me if she develops any of these before her next scheduled visit.  I will call the patient once I have PET findings to discuss any additional treatment or work-up necessary at this time.  A copy of this note was sent to the patient's referring provider.   65 minutes of total time was spent for this patient encounter, including preparation, face-to-face counseling with the patient and coordination of care, and documentation of the encounter.   Jeral Pinch, MD  Division of Gynecologic Oncology  Department of Obstetrics and Gynecology  [facility]  ___________________________________________  Chief Complaint: Chief Complaint  Patient presents with   Malignant neoplasm of cervix, unspecified site [facility])    History of Present Illness:  Taylor Berg is a 66 y.o. y.o. female who is seen in consultation at the request of Dr. Adah Perl for an evaluation of incidental diagnosis of cervix cancer.  Treatment history is as noted below.  Patient has a remote history of an abnormal Pap in 2009 per her report that was treated with a cold knife cone revealing precancer.  She endorses having normal Pap smears since.  She then had a Pap smear this summer that was abnormal as noted here:  07/21/21: pap - HSIL 09/19/21: Cervical/endocervical biopsy shows H SIL (  CIN-3) and superficial fragments of cervical tissue.  Comment is that due to tissue fragmentation and superficial nature of the tissue, invasive process cannot be excluded. 12/08/2021: Vaginal hysterectomy and cystoscopy with Dr. Evie Lacks.  Findings at the time of surgery were normal uterus and cervix. Pathology: Invasive squamous cell carcinoma, grade 2.  Tumor size 7 mm.  Depth of  stromal invasion 2 mm.  Margins uninvolved by carcinoma.  Margins uninvolved by high-grade dysplasia.  LVSI not identified.  High-grade squamous intraepithelial lesion noted to involve the endocervical glands.  Endometrium with benign endometrial type polyp.  She denies any vaginal bleeding, discharge, or pain prior to surgery.  She has recovered well from surgery.  She denies any abdominal or pelvic pain.  She reports normal bowel bladder function.  She denies any vaginal bleeding or discharge.  She endorses a good appetite without nausea or emesis.  She notes having a skin reaction after her CT scan, where she received p.o. and IV contrast.  This caused significant pruritus as well as erythema around her bra line and chest as well as in her groin and vulva.  Itching has almost resolved, she is using Vaseline now.  Patient lives in [place].  Her husband recently died in 2023/12/07.  One of her sons is living with her and a second may move in soon.  PAST MEDICAL HISTORY:  Past Medical History:  Diagnosis Date   BMI 38.0-38.9,adult    history of anemia    Hypothyroidism      PAST SURGICAL HISTORY:  Past Surgical History:  Procedure Laterality Date   APPENDECTOMY     CERVICAL CONE BIOPSY     hx of   GAS INSERTION  02/16/2012   Procedure: INSERTION OF GAS;  Surgeon: Hayden Pedro, MD;  Location: [place];  Service: Ophthalmology;  Laterality: Right;  C3F8   PARS PLANA VITRECTOMY W/ SCLERAL BUCKLE  02/16/2012   Procedure: PARS PLANA VITRECTOMY WITH LASER FOR MACULAR HOLE;  Surgeon: Hayden Pedro, MD;  Location: [place];  Service: Ophthalmology;  Laterality: Right;  25 GAUGE PPV   TONSILLECTOMY     TUBAL LIGATION     VAGINAL HYSTERECTOMY  12/08/2021    OB/GYN HISTORY:  OB History  Gravida Para Term Preterm AB Living  3 3          SAB IAB Ectopic Multiple Live Births               # Outcome Date GA Lbr Len/2nd Weight Sex Delivery Anes PTL Lv  3 Para           2 Para           1  Para             No LMP recorded. Patient is postmenopausal.  Age at menarche: 40 Age at menopause: 2 Hx of HRT: Denies Hx of STDs: Denies.  I do not see HPV testing, but I suspect that the patient has HPV Last pap: See HPI History of abnormal pap smears: See HPI  SCREENING STUDIES:  Last mammogram: 2021  Last colonoscopy: Has never had  MEDICATIONS: Outpatient Encounter Medications as of 12/29/2021  Medication Sig   Docusate Sodium (DSS) 100 MG CAPS Take by mouth.   levothyroxine (SYNTHROID) 100 MCG tablet Take by mouth.   triamcinolone (KENALOG) 0.025 % ointment Apply 1 application topically 2 (two) times daily.   gatifloxacin (ZYMAXID) 0.5 % SOLN Place 1 drop into the right eye 4 (four)  times daily. (Patient not taking: Reported on 12/29/2021)   levothyroxine (SYNTHROID, LEVOTHROID) 112 MCG tablet Take 100 mcg by mouth daily. (Patient not taking: Reported on 12/29/2021)   No facility-administered encounter medications on file as of 12/29/2021.    ALLERGIES:  Allergies  Allergen Reactions   Penicillins Anaphylaxis   Sulfamethoxazole-Trimethoprim Hives and Rash   Iodinated Contrast Media Rash    Under breast and groin rash that developed after IV and PO contrast for CT scan in 12/2021   Latex Rash   Sulfa Drugs Cross Reactors Hives and Rash   Wound Dressing Adhesive Rash    bandaids cause skin breakout     FAMILY HISTORY:  Family History  Problem Relation Age of Onset   Colon cancer Neg Hx    Breast cancer Neg Hx    Ovarian cancer Neg Hx    Endometrial cancer Neg Hx    Pancreatic cancer Neg Hx      SOCIAL HISTORY:  Social Connections: Not on file    REVIEW OF SYSTEMS:  Denies appetite changes, fevers, chills, fatigue, unexplained weight changes. Denies hearing loss, neck lumps or masses, mouth sores, ringing in ears or voice changes. Denies cough or wheezing.  Denies shortness of breath. Denies chest pain or palpitations. Denies leg swelling. Denies abdominal  distention, pain, blood in stools, constipation, diarrhea, nausea, vomiting, or early satiety. Denies pain with intercourse, dysuria, frequency, hematuria or incontinence. Denies hot flashes, pelvic pain, vaginal bleeding or vaginal discharge.   Denies joint pain, back pain or muscle pain/cramps. Denies itching, rash, or wounds. Denies dizziness, headaches, numbness or seizures. Denies swollen lymph nodes or glands, denies easy bruising or bleeding. Denies anxiety, depression, confusion, or decreased concentration.  Physical Exam:  Vital Signs for this encounter:  Blood pressure 132/73, pulse 67, temperature 99.2 F (37.3 C), temperature source Tympanic, resp. rate 18, height 5\' 6"  (1.676 m), weight 236 lb (107 kg), SpO2 100 %. Body mass index is 38.09 kg/m. General: Alert, oriented, no acute distress.  HEENT: Normocephalic, atraumatic. Sclera anicteric.  Chest: Clear to auscultation bilaterally. No wheezes, rhonchi, or rales.   Cardiovascular: Regular rate and rhythm, no murmurs, rubs, or gallops.  Abdomen: Obese. Normoactive bowel sounds. Soft, nondistended, nontender to palpation. No masses or hepatosplenomegaly appreciated. No palpable fluid wave.  Extremities: Grossly normal range of motion. Warm, well perfused. No edema bilaterally.  Skin: Erythematous, nonraised rash over midportion of the chest and under the patient's breast bilaterally as well as in her groin.  There are some areas under the breasts that have small blisters. Lymphatics: No cervical, supraclavicular, or inguinal adenopathy.  GU:  Normal external female genitalia.  No lesions. No discharge or bleeding.             Bladder/urethra:  No lesions or masses, well supported bladder             Vagina: No lesions or masses.  Cuff is intact with suture visible.             Cervix/uterus: Surgically absent.             Adnexa: No masses appreciated.  Rectal: No nodularity.  LABORATORY AND RADIOLOGIC DATA:  Outside  medical records were reviewed to synthesize the above history, along with the history and physical obtained during the visit.   Lab Results  Component Value Date   WBC 5.5 02/16/2012   HGB 13.5 02/16/2012   HCT 39.6 02/16/2012   PLT 254 02/16/2012   CT A/P  on 1/6: FINDINGS:  Lower chest: Normal heart size. Minimal dependent atelectasis within  the lung bases bilaterally.   Hepatobiliary: Liver is normal in size and contour. Multiple stones  within the gallbladder lumen. No gallbladder wall thickening or  pericholecystic fluid. No intrahepatic or extrahepatic biliary  ductal dilatation.   Pancreas: Unremarkable   Spleen: Unremarkable   Adrenals/Urinary Tract: Normal adrenal glands. Kidneys enhance  symmetrically with contrast. No hydronephrosis. Urinary bladder is  unremarkable.   Stomach/Bowel: Sigmoid colonic diverticulosis. No CT evidence for  acute diverticulitis. Normal morphology of the stomach. No evidence  for bowel obstruction.   Vascular/Lymphatic: Normal caliber abdominal aorta. 6 mm left  periaortic node (image 44; series 2). 9 mm right external iliac  lymph node (image 66; series 2).   Reproductive: Patient status post hysterectomy. Normal appearance of  the ovaries bilaterally. No pelvic mass identified.   Other: Fat containing periumbilical hernia.   Musculoskeletal: Lower thoracic and lumbar spine degenerative  changes. No aggressive or acute appearing osseous lesions.  Pathology review at [facility]: A: Uterus and cervix, resection, slides 1B, 1D, and 1E reviewed - Cervix with invasive squamous cell carcinoma, poorly differentiated, size at least 0.7 cm with 2.5 mm depth of invasion (stage pT1a1 pNx). Invasive carcinoma is 1.5 mm from deep inked cervical wall margin.  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

## 2021-12-29 NOTE — Patient Instructions (Signed)
It was a pleasure meeting you today.  You are healing well from surgery.  I do not feel anything concerning on your exam.  Given your reaction after the CT scan, I suspect that you had an allergic reaction to either the oral or IV contrast.  I am sending a topical steroid cream to your pharmacy to use.  If your symptoms do not improve or they improved but you still have some redness, please let me know as you may also have a little bit of a yeast infection.  Given the way a couple lymph nodes looked on your CT scan, we are getting get a PET scan to see if we can tell whether this is just related to recent surgery or whether there is concern for cancer in 1 or both of those lymph nodes.  I will call you when I have these results.  As we discussed, we will plan on starting with follow-up visits every 3 months.  If you develop any concerning symptoms, such as vaginal bleeding, discharge, pelvic pain, change to bowel function, or unintentional weight loss, please call the clinic at [phone] to see me sooner.

## 2021-12-30 ENCOUNTER — Telehealth: Payer: Self-pay | Admitting: Oncology

## 2021-12-30 NOTE — Telephone Encounter (Signed)
Angel from [facility] called back and said per Dr. Martinique, the depth of invasion is 2 mm.  He said it is not any less than that and may be a small amount more but was difficult to tell.

## 2021-12-30 NOTE — Telephone Encounter (Signed)
Faxed request to [facility] pathology to clarify depth of invasion and requested PD-L1 testing.

## 2022-01-08 ENCOUNTER — Ambulatory Visit (HOSPITAL_COMMUNITY)
Admission: RE | Admit: 2022-01-08 | Discharge: 2022-01-08 | Disposition: A | Discharge status: Home / Self Care | Payer: Medicare Other | Source: Ambulatory Visit | Attending: Gynecologic Oncology | Admitting: Gynecologic Oncology

## 2022-01-08 ENCOUNTER — Other Ambulatory Visit: Payer: Self-pay

## 2022-01-08 DIAGNOSIS — C539 Malignant neoplasm of cervix uteri, unspecified: Secondary | ICD-10-CM | POA: Diagnosis present

## 2022-01-08 LAB — GLUCOSE, CAPILLARY: Glucose-Capillary: 100 mg/dL — ABNORMAL HIGH (ref 70–99)

## 2022-01-08 MED ORDER — FLUDEOXYGLUCOSE F - 18 (FDG) INJECTION
11.8000 | Freq: Once | INTRAVENOUS | Status: AC | PRN
  Administered 2022-01-08: 11.8 via INTRAVENOUS

## 2022-01-12 ENCOUNTER — Telehealth: Payer: Self-pay | Admitting: Oncology

## 2022-01-12 ENCOUNTER — Other Ambulatory Visit: Payer: Self-pay | Admitting: Oncology

## 2022-01-12 NOTE — Progress Notes (Signed)
Gynecologic Oncology Multi-Disciplinary Disposition Conference Note  Date of the Conference: 01/12/2022  Patient Name: Taylor Berg  Referring Provider: Dr. Adah Perl Primary GYN Oncologist: Dr. 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 Patient does not meet criteria for lymph node assessment so recommendation is for close surveillance.   This Multidisciplinary conference took place involving physicians from [place], Medical Oncology, Radiation Oncology, Pathology, Radiology along with the Gynecologic Oncology Nurse Practitioner and Gynecologic Oncology Nurse Navigator.  Comprehensive assessment of the patient's malignancy, staging, need for surgery, chemotherapy, radiation therapy, and need for further testing were reviewed. Supportive measures, both inpatient and following discharge were also discussed. The recommended plan of care is documented. Greater than 35 minutes were spent correlating and coordinating this patient's care.

## 2022-01-12 NOTE — Telephone Encounter (Signed)
Called Debbie and advised her of the [place] recommendation for close surveillance.  She verbalized understanding and agreement and is aware of her appointment with Dr. Berline Lopes in April.

## 2022-02-24 ENCOUNTER — Ambulatory Visit (INDEPENDENT_AMBULATORY_CARE_PROVIDER_SITE_OTHER): Payer: Medicare Other | Admitting: Family Medicine

## 2022-02-24 ENCOUNTER — Encounter: Payer: Self-pay | Admitting: Family Medicine

## 2022-02-24 VITALS — BP 158/75 | HR 64 | Temp 97.2°F | Resp 20 | Ht 66.0 in | Wt 245.0 lb

## 2022-02-24 DIAGNOSIS — Z6839 Body mass index (BMI) 39.0-39.9, adult: Secondary | ICD-10-CM

## 2022-02-24 DIAGNOSIS — R030 Elevated blood-pressure reading, without diagnosis of hypertension: Secondary | ICD-10-CM

## 2022-02-24 DIAGNOSIS — E039 Hypothyroidism, unspecified: Secondary | ICD-10-CM

## 2022-02-24 DIAGNOSIS — Z90711 Acquired absence of uterus with remaining cervical stump: Secondary | ICD-10-CM | POA: Diagnosis not present

## 2022-02-24 DIAGNOSIS — F4323 Adjustment disorder with mixed anxiety and depressed mood: Secondary | ICD-10-CM | POA: Diagnosis not present

## 2022-02-24 NOTE — Patient Instructions (Signed)

## 2022-02-24 NOTE — Progress Notes (Signed)
Subjective:  Patient ID: Taylor Berg, female    DOB: 14-Feb-1956, 66 y.o.   MRN: [id number]  Patient Care Team: Baruch Gouty, FNP as PCP - General (Family Medicine)   Chief Complaint:  Establish Care (New )   HPI: Taylor Berg is a 66 y.o. female presenting on 02/24/2022 for Establish Care (New )   Patient presents today to establish care with new PCP.  She was formally followed by [facility] Department. She last saw them over a year ago. She is followed by GYN on a regular basis and recently had a partial hysterectomy due to cervical malignancy.  She has a history of acquired hypothyroidism and is on repletion therapy.  She reports she lost her husband recently and is having some anxiety and depression related to this.  She is not on any medications for her anxiety or depression and does not wish to start at this time.  She denies SI or HI. GAD 7 : Generalized Anxiety Score 02/24/2022  Nervous, Anxious, on Edge 0  Control/stop worrying 0  Worry too much - different things 0  Trouble relaxing 1  Restless 0  Easily annoyed or irritable 0  Afraid - awful might happen 0  Total GAD 7 Score 1  Anxiety Difficulty Somewhat difficult    Depression screen PHQ 2/9 02/24/2022  Decreased Interest 1  Down, Depressed, Hopeless 1  PHQ - 2 Score 2  Altered sleeping 0  Tired, decreased energy 1  Change in appetite 1  Feeling bad or failure about yourself  1  Trouble concentrating 0  Moving slowly or fidgety/restless 0  Suicidal thoughts 0  PHQ-9 Score 5  Difficult doing work/chores Somewhat difficult      Relevant past medical, surgical, family, and social history reviewed and updated as indicated.  Allergies and medications reviewed and updated. Data reviewed: Chart in Epic.   Past Medical History:  Diagnosis Date   BMI 38.0-38.9,adult    history of anemia    Hypothyroidism     Past Surgical History:  Procedure Laterality Date   APPENDECTOMY     CERVICAL CONE  BIOPSY     hx of   GAS INSERTION  02/16/2012   Procedure: INSERTION OF GAS;  Surgeon: Hayden Pedro, MD;  Location: [place];  Service: Ophthalmology;  Laterality: Right;  C3F8   PARS PLANA VITRECTOMY W/ SCLERAL BUCKLE  02/16/2012   Procedure: PARS PLANA VITRECTOMY WITH LASER FOR MACULAR HOLE;  Surgeon: Hayden Pedro, MD;  Location: [place];  Service: Ophthalmology;  Laterality: Right;  25 GAUGE PPV   TONSILLECTOMY     TUBAL LIGATION     VAGINAL HYSTERECTOMY  12/08/2021    Social History   Socioeconomic History   Marital status: Widowed    Spouse name: Not on file   Number of children: 3   Years of education: Not on file   Highest education level: Not on file  Occupational History   Occupation: retired  Tobacco Use   Smoking status: Former    Types: Cigarettes    Quit date: 2007    Years since quitting: 16.1   Smokeless tobacco: Not on file   Tobacco comments:    stopped smoking 2007  Vaping Use   Vaping Use: Never used  Substance and Sexual Activity   Alcohol use: Not Currently   Drug use: No   Sexual activity: Not Currently  Other Topics Concern   Not on file  Social  History Narrative   Patient reports that husband passed away on 01/13/2022, has 3 sons (healthy)    Social Determinants of Health   Financial Resource Strain: Not on file  Food Insecurity: Not on file  Transportation Needs: Not on file  Physical Activity: Not on file  Stress: Not on file  Social Connections: Not on file  Intimate Partner Violence: Not on file    Outpatient Encounter Medications as of 02/24/2022  Medication Sig   levothyroxine (SYNTHROID) 100 MCG tablet Take by mouth.   [DISCONTINUED] gatifloxacin (ZYMAXID) 0.5 % SOLN Place 1 drop into the right eye 4 (four) times daily. (Patient not taking: Reported on 12/29/2021)   [DISCONTINUED] triamcinolone (KENALOG) 0.025 % ointment Apply 1 application topically 2 (two) times daily.   No facility-administered encounter medications on file as of  02/24/2022.    Allergies  Allergen Reactions   Penicillins Anaphylaxis   Sulfamethoxazole-Trimethoprim Hives and Rash   Iodinated Contrast Media Rash    Under breast and groin rash that developed after IV and PO contrast for CT scan in 12/2021   Latex Rash   Sulfa Drugs Cross Reactors Hives and Rash   Wound Dressing Adhesive Rash    bandaids cause skin breakout    Review of Systems  Constitutional:  Positive for activity change, appetite change and fatigue. Negative for chills, diaphoresis, fever and unexpected weight change.  HENT: Negative.    Eyes: Negative.   Respiratory:  Negative for cough, chest tightness and shortness of breath.   Cardiovascular:  Negative for chest pain, palpitations and leg swelling.  Gastrointestinal:  Negative for abdominal pain, blood in stool, constipation, diarrhea, nausea and vomiting.  Endocrine: Negative.   Genitourinary:  Negative for dysuria, frequency and urgency.  Musculoskeletal:  Negative for arthralgias and myalgias.  Skin: Negative.   Allergic/Immunologic: Negative.   Neurological:  Negative for dizziness and headaches.  Hematological: Negative.   Psychiatric/Behavioral:  Positive for dysphoric mood. Negative for agitation, behavioral problems, confusion, decreased concentration, hallucinations, self-injury, sleep disturbance and suicidal ideas. The patient is not nervous/anxious and is not hyperactive.   All other systems reviewed and are negative.      Objective:  BP (!) 158/75    Pulse 64    Temp (!) 97.2 F (36.2 C)    Resp 20    Ht '5\' 6"'  (1.676 m)    Wt 245 lb (111.1 kg)    SpO2 98%    BMI 39.54 kg/m    Wt Readings from Last 3 Encounters:  02/24/22 245 lb (111.1 kg)  12/29/21 236 lb (107 kg)  02/15/12 261 lb (118.4 kg)    Physical Exam Vitals and nursing note reviewed.  Constitutional:      General: She is not in acute distress.    Appearance: Normal appearance. She is well-developed and well-groomed. She is obese. She is  not ill-appearing, toxic-appearing or diaphoretic.  HENT:     Head: Normocephalic and atraumatic.     Jaw: There is normal jaw occlusion.     Right Ear: Hearing normal.     Left Ear: Hearing normal.     Nose: Nose normal.     Mouth/Throat:     Lips: Pink.     Mouth: Mucous membranes are moist.     Pharynx: Oropharynx is clear. Uvula midline.  Eyes:     General: Lids are normal.     Extraocular Movements: Extraocular movements intact.     Conjunctiva/sclera: Conjunctivae normal.     Pupils: Pupils  are equal, round, and reactive to light.  Neck:     Thyroid: No thyroid mass, thyromegaly or thyroid tenderness.     Vascular: No carotid bruit or JVD.     Trachea: Trachea and phonation normal.  Cardiovascular:     Rate and Rhythm: Normal rate and regular rhythm.     Chest Wall: PMI is not displaced.     Pulses: Normal pulses.     Heart sounds: Normal heart sounds. No murmur heard.   No friction rub. No gallop.  Pulmonary:     Effort: Pulmonary effort is normal. No respiratory distress.     Breath sounds: Normal breath sounds. No wheezing.  Abdominal:     General: Bowel sounds are normal. There is no distension or abdominal bruit.     Palpations: Abdomen is soft. There is no hepatomegaly or splenomegaly.     Tenderness: There is no abdominal tenderness. There is no right CVA tenderness or left CVA tenderness.     Hernia: No hernia is present.  Musculoskeletal:        General: Normal range of motion.     Cervical back: Normal range of motion and neck supple.     Right lower leg: No edema.     Left lower leg: No edema.  Lymphadenopathy:     Cervical: No cervical adenopathy.  Skin:    General: Skin is warm and dry.     Capillary Refill: Capillary refill takes less than 2 seconds.     Coloration: Skin is not cyanotic, jaundiced or pale.     Findings: No rash.  Neurological:     General: No focal deficit present.     Mental Status: She is alert and oriented to person, place, and  time.     Sensory: Sensation is intact.     Motor: Motor function is intact.     Coordination: Coordination is intact.     Gait: Gait is intact.     Deep Tendon Reflexes: Reflexes are normal and symmetric.  Psychiatric:        Attention and Perception: Attention and perception normal.        Mood and Affect: Affect normal. Mood is depressed.        Speech: Speech normal.        Behavior: Behavior normal. Behavior is cooperative.        Thought Content: Thought content normal.        Cognition and Memory: Cognition and memory normal.        Judgment: Judgment normal.    Results for orders placed or performed during the hospital encounter of 01/08/22  Glucose, capillary  Result Value Ref Range   Glucose-Capillary 100 (H) 70 - 99 mg/dL       Pertinent labs & imaging results that were available during my care of the patient were reviewed by me and considered in my medical decision making.  Assessment & Plan:  Laritza was seen today for establish care.  Diagnoses and all orders for this visit:  Acquired hypothyroidism Thyroid disease has been well controlled. Labs are pending. Adjustments to regimen will be made if warranted. Make sure to take medications on an empty stomach with a full glass of water. Make sure to avoid vitamins or supplements for at least 4 hours before and 4 hours after taking medications. Repeat labs in 3 months if adjustments are made and in 6 months if stable.   -     Thyroid Panel With TSH  BMI 39.0-39.9,adult Diet and exercise encouraged. Labs pending. Repeat BMI at next visit.  -     CBC with Differential/Platelet -     CMP14+EGFR -     Lipid panel -     Thyroid Panel With TSH  S/P partial hysterectomy Followed by GYN and oncology on a regular basis.  Situational mixed anxiety and depressive disorder Does not wish to start medications at this time. Does not wish to be referred to counselor. Will check thyroid function. Pt aware to report any new,  worsening, or persistent symptoms.  -     Thyroid Panel With TSH  Elevated blood pressure reading in office without diagnosis of hypertension -     CBC with Differential/Platelet -     CMP14+EGFR -     Lipid panel -     Thyroid Panel With TSH     Continue all other maintenance medications.  Follow up plan: Return in about 2 weeks (around 03/10/2022), or if symptoms worsen or fail to improve, for Anxiety, Depression, BP.   Continue healthy lifestyle choices, including diet (rich in fruits, vegetables, and lean proteins, and low in salt and simple carbohydrates) and exercise (at least 30 minutes of moderate physical activity daily).  Educational handout given for DASH diet, GAD, depression  The above assessment and management plan was discussed with the patient. The patient verbalized understanding of and has agreed to the management plan. Patient is aware to call the clinic if they develop any new symptoms or if symptoms persist or worsen. Patient is aware when to return to the clinic for a follow-up visit. Patient educated on when it is appropriate to go to the emergency department.   Monia Pouch, FNP-C [facility] [phone]

## 2022-02-25 LAB — CMP14+EGFR
ALT: 15 IU/L (ref 0–32)
AST: 21 IU/L (ref 0–40)
Albumin/Globulin Ratio: 1.8 (ref 1.2–2.2)
Albumin: 4.5 g/dL (ref 3.8–4.8)
Alkaline Phosphatase: 87 IU/L (ref 44–121)
BUN/Creatinine Ratio: 16 (ref 12–28)
BUN: 14 mg/dL (ref 8–27)
Bilirubin Total: 0.5 mg/dL (ref 0.0–1.2)
CO2: 22 mmol/L (ref 20–29)
Calcium: 9.7 mg/dL (ref 8.7–10.3)
Chloride: 103 mmol/L (ref 96–106)
Creatinine, Ser: 0.87 mg/dL (ref 0.57–1.00)
Globulin, Total: 2.5 g/dL (ref 1.5–4.5)
Glucose: 85 mg/dL (ref 70–99)
Potassium: 4.7 mmol/L (ref 3.5–5.2)
Sodium: 142 mmol/L (ref 134–144)
Total Protein: 7 g/dL (ref 6.0–8.5)
eGFR: 73 mL/min/{1.73_m2} (ref >59)

## 2022-02-25 LAB — CBC WITH DIFFERENTIAL/PLATELET
Basophils Absolute: 0.1 10*3/uL (ref 0.0–0.2)
Basos: 1 %
EOS (ABSOLUTE): 0.1 10*3/uL (ref 0.0–0.4)
Eos: 3 %
Hematocrit: 38.9 % (ref 34.0–46.6)
Hemoglobin: 13.2 g/dL (ref 11.1–15.9)
Immature Grans (Abs): 0 10*3/uL (ref 0.0–0.1)
Immature Granulocytes: 0 %
Lymphocytes Absolute: 1.3 10*3/uL (ref 0.7–3.1)
Lymphs: 31 %
MCH: 30.9 pg (ref 26.6–33.0)
MCHC: 33.9 g/dL (ref 31.5–35.7)
MCV: 91 fL (ref 79–97)
Monocytes Absolute: 0.4 10*3/uL (ref 0.1–0.9)
Monocytes: 10 %
Neutrophils Absolute: 2.4 10*3/uL (ref 1.4–7.0)
Neutrophils: 55 %
Platelets: 270 10*3/uL (ref 150–450)
RBC: 4.27 x10E6/uL (ref 3.77–5.28)
RDW: 13.9 % (ref 11.7–15.4)
WBC: 4.3 10*3/uL (ref 3.4–10.8)

## 2022-02-25 LAB — THYROID PANEL WITH TSH
Free Thyroxine Index: 4.3 (ref 1.2–4.9)
T3 Uptake Ratio: 50 % — ABNORMAL HIGH (ref 24–39)
T4, Total: 8.6 ug/dL (ref 4.5–12.0)
TSH: 10 u[IU]/mL — ABNORMAL HIGH (ref 0.450–4.500)

## 2022-02-25 LAB — LIPID PANEL
Chol/HDL Ratio: 3.4 ratio (ref 0.0–4.4)
Cholesterol, Total: 238 mg/dL — ABNORMAL HIGH (ref 100–199)
HDL: 71 mg/dL (ref >39)
LDL Chol Calc (NIH): 154 mg/dL — ABNORMAL HIGH (ref 0–99)
Triglycerides: 76 mg/dL (ref 0–149)
VLDL Cholesterol Cal: 13 mg/dL (ref 5–40)

## 2022-02-25 MED ORDER — LEVOTHYROXINE SODIUM 112 MCG PO TABS
112.0000 ug | ORAL_TABLET | Freq: Every day | ORAL | 3 refills | Status: DC
Start: 2022-02-25 — End: 2022-06-18

## 2022-02-25 NOTE — Addendum Note (Signed)
Addended by: Baruch Gouty on: 02/25/2022 08:26 AM ? ? Modules accepted: Orders ? ?

## 2022-03-18 ENCOUNTER — Encounter: Payer: Self-pay | Admitting: Family Medicine

## 2022-03-18 ENCOUNTER — Ambulatory Visit (INDEPENDENT_AMBULATORY_CARE_PROVIDER_SITE_OTHER): Payer: Medicare Other | Admitting: Family Medicine

## 2022-03-18 VITALS — BP 130/75 | HR 70 | Temp 97.8°F | Ht 66.0 in | Wt 243.8 lb

## 2022-03-18 DIAGNOSIS — R030 Elevated blood-pressure reading, without diagnosis of hypertension: Secondary | ICD-10-CM

## 2022-03-18 DIAGNOSIS — F4323 Adjustment disorder with mixed anxiety and depressed mood: Secondary | ICD-10-CM | POA: Diagnosis not present

## 2022-03-18 MED ORDER — ESCITALOPRAM OXALATE 10 MG PO TABS: 10.0000 mg | ORAL_TABLET | Freq: Every day | ORAL | 1 refills | Status: DC

## 2022-03-18 NOTE — Progress Notes (Addendum)
?  ? ?Subjective:  ?Patient ID: Taylor Berg, female    DOB: 30-Jun-1956, 66 y.o.   MRN: [id number] ? ?Patient Care Team: ?Baruch Gouty, FNP as PCP - General (Family Medicine)  ? ?Chief Complaint:  Anxiety, Depression, and Hypertension (2 week follow up) ? ? ?HPI: ?Taylor Berg is a 66 y.o. female presenting on 03/18/2022 for Anxiety, Depression, and Hypertension (2 week follow up) ? ? ?Pt presents for follow up of anxiety, depression in the grieving period. She is desires to begin SSRI therapy today. Associated symptoms include crying spells and headaches.  ? ?Anxiety ?Presents for follow-up visit. Symptoms include decreased concentration and nervous/anxious behavior. Patient reports no chest pain or shortness of breath.  ? ? ?Depression ?       Associated symptoms include decreased concentration.  Past medical history includes anxiety.   ?Hypertension ?Associated symptoms include anxiety. Pertinent negatives include no chest pain or shortness of breath.  ? ?  03/18/2022  ?  3:39 PM 02/24/2022  ? 12:04 PM  ?Depression screen PHQ 2/9  ?Decreased Interest 2 1  ?Down, Depressed, Hopeless 1 1  ?PHQ - 2 Score 3 2  ?Altered sleeping 1 0  ?Tired, decreased energy 1 1  ?Change in appetite 1 1  ?Feeling bad or failure about yourself  1 1  ?Trouble concentrating 0 0  ?Moving slowly or fidgety/restless 0 0  ?Suicidal thoughts 0 0  ?PHQ-9 Score 7 5  ?Difficult doing work/chores Somewhat difficult Somewhat difficult  ? ? ?  03/18/2022  ?  3:39 PM 02/24/2022  ? 12:04 PM  ?GAD 7 : Generalized Anxiety Score  ?Nervous, Anxious, on Edge 1 0  ?Control/stop worrying 1 0  ?Worry too much - different things 0 0  ?Trouble relaxing 1 1  ?Restless 0 0  ?Easily annoyed or irritable 0 0  ?Afraid - awful might happen 0 0  ?Total GAD 7 Score 3 1  ?Anxiety Difficulty Not difficult at all Somewhat difficult  ? ? ? ? ? ?Relevant past medical, surgical, family, and social history reviewed and updated as indicated.  ?Allergies and medications reviewed  and updated. Data reviewed: Chart in Epic. ? ? ?Past Medical History:  ?Diagnosis Date  ? BMI 38.0-38.9,adult   ? history of anemia   ? Hypothyroidism   ? ? ?Past Surgical History:  ?Procedure Laterality Date  ? APPENDECTOMY    ? CERVICAL CONE BIOPSY    ? hx of  ? GAS INSERTION  02/16/2012  ? Procedure: INSERTION OF GAS;  Surgeon: Hayden Pedro, MD;  Location: [place];  Service: Ophthalmology;  Laterality: Right;  C3F8  ? PARS PLANA VITRECTOMY W/ SCLERAL BUCKLE  02/16/2012  ? Procedure: PARS PLANA VITRECTOMY WITH LASER FOR MACULAR HOLE;  Surgeon: Hayden Pedro, MD;  Location: [place];  Service: Ophthalmology;  Laterality: Right;  25 GAUGE PPV  ? TONSILLECTOMY    ? TUBAL LIGATION    ? VAGINAL HYSTERECTOMY  12/08/2021  ? ? ?Social History  ? ?Socioeconomic History  ? Marital status: Widowed  ?  Spouse name: Not on file  ? Number of children: 3  ? Years of education: Not on file  ? Highest education level: Not on file  ?Occupational History  ? Occupation: retired  ?Tobacco Use  ? Smoking status: Former  ?  Types: Cigarettes  ?  Quit date: 2007  ?  Years since quitting: 16.2  ? Smokeless tobacco: Not on file  ? Tobacco comments:  ?  stopped smoking 2007  ?Vaping Use  ? Vaping Use: Never used  ?Substance and Sexual Activity  ? Alcohol use: Not Currently  ? Drug use: No  ? Sexual activity: Not Currently  ?Other Topics Concern  ? Not on file  ?Social History Narrative  ? Patient reports that husband passed away on 12/22/2021, has 3 sons (healthy)   ? ?Social Determinants of Health  ? ?Financial Resource Strain: Not on file  ?Food Insecurity: Not on file  ?Transportation Needs: Not on file  ?Physical Activity: Not on file  ?Stress: Not on file  ?Social Connections: Not on file  ?Intimate Partner Violence: Not on file  ? ? ?Outpatient Encounter Medications as of 03/18/2022  ?Medication Sig  ? levothyroxine (SYNTHROID) 112 MCG tablet Take 1 tablet (112 mcg total) by mouth daily.  ? ?No facility-administered encounter  medications on file as of 03/18/2022.  ? ? ?Allergies  ?Allergen Reactions  ? Penicillins Anaphylaxis  ? Sulfamethoxazole-Trimethoprim Hives and Rash  ? Iodinated Contrast Media Rash  ?  Under breast and groin rash that developed after IV and PO contrast for CT scan in 12/2021  ? Latex Rash  ? Sulfa Drugs Cross Reactors Hives and Rash  ? Wound Dressing Adhesive Rash  ?  bandaids cause skin breakout  ? ? ?Review of Systems  ?Respiratory:  Negative for chest tightness and shortness of breath.   ?Cardiovascular:  Negative for chest pain.  ?Psychiatric/Behavioral:  Positive for decreased concentration and depression. The patient is nervous/anxious.   ?All other systems reviewed and are negative. ? ?   ? ?Objective:  ?BP 130/75 Comment: AT HOME READING  Pulse 70   Temp 97.8 ?F (36.6 ?C) (Temporal)   Ht '5\' 6"'  (1.676 m)   Wt 110.6 kg   SpO2 98%   BMI 39.35 kg/m?   ? ?Wt Readings from Last 3 Encounters:  ?03/18/22 110.6 kg  ?02/24/22 111.1 kg  ?12/29/21 107 kg  ? ? ?Physical Exam ?Psychiatric:     ?   Attention and Perception: Attention normal.     ?   Mood and Affect: Affect is tearful.     ?   Speech: Speech normal.     ?   Behavior: Behavior normal.  ? ? ?Results for orders placed or performed in visit on 02/24/22  ?CBC with Differential/Platelet  ?Result Value Ref Range  ? WBC 4.3 3.4 - 10.8 x10E3/uL  ? RBC 4.27 3.77 - 5.28 x10E6/uL  ? Hemoglobin 13.2 11.1 - 15.9 g/dL  ? Hematocrit 38.9 34.0 - 46.6 %  ? MCV 91 79 - 97 fL  ? MCH 30.9 26.6 - 33.0 pg  ? MCHC 33.9 31.5 - 35.7 g/dL  ? RDW 13.9 11.7 - 15.4 %  ? Platelets 270 150 - 450 x10E3/uL  ? Neutrophils 55 Not Estab. %  ? Lymphs 31 Not Estab. %  ? Monocytes 10 Not Estab. %  ? Eos 3 Not Estab. %  ? Basos 1 Not Estab. %  ? Neutrophils Absolute 2.4 1.4 - 7.0 x10E3/uL  ? Lymphocytes Absolute 1.3 0.7 - 3.1 x10E3/uL  ? Monocytes Absolute 0.4 0.1 - 0.9 x10E3/uL  ? EOS (ABSOLUTE) 0.1 0.0 - 0.4 x10E3/uL  ? Basophils Absolute 0.1 0.0 - 0.2 x10E3/uL  ? Immature Granulocytes 0  Not Estab. %  ? Immature Grans (Abs) 0.0 0.0 - 0.1 x10E3/uL  ?CMP14+EGFR  ?Result Value Ref Range  ? Glucose 85 70 - 99 mg/dL  ? BUN 14 8 - 27  mg/dL  ? Creatinine, Ser 0.87 0.57 - 1.00 mg/dL  ? eGFR 73 >59 mL/min/1.73  ? BUN/Creatinine Ratio 16 12 - 28  ? Sodium 142 134 - 144 mmol/L  ? Potassium 4.7 3.5 - 5.2 mmol/L  ? Chloride 103 96 - 106 mmol/L  ? CO2 22 20 - 29 mmol/L  ? Calcium 9.7 8.7 - 10.3 mg/dL  ? Total Protein 7.0 6.0 - 8.5 g/dL  ? Albumin 4.5 3.8 - 4.8 g/dL  ? Globulin, Total 2.5 1.5 - 4.5 g/dL  ? Albumin/Globulin Ratio 1.8 1.2 - 2.2  ? Bilirubin Total 0.5 0.0 - 1.2 mg/dL  ? Alkaline Phosphatase 87 44 - 121 IU/L  ? AST 21 0 - 40 IU/L  ? ALT 15 0 - 32 IU/L  ?Lipid panel  ?Result Value Ref Range  ? Cholesterol, Total 238 (H) 100 - 199 mg/dL  ? Triglycerides 76 0 - 149 mg/dL  ? HDL 71 >39 mg/dL  ? VLDL Cholesterol Cal 13 5 - 40 mg/dL  ? LDL Chol Calc (NIH) 154 (H) 0 - 99 mg/dL  ? Chol/HDL Ratio 3.4 0.0 - 4.4 ratio  ?Thyroid Panel With TSH  ?Result Value Ref Range  ? TSH 10.000 (H) 0.450 - 4.500 uIU/mL  ? T4, Total 8.6 4.5 - 12.0 ug/dL  ? T3 Uptake Ratio 50 (H) 24 - 39 %  ? Free Thyroxine Index 4.3 1.2 - 4.9  ? ?   ? ?Pertinent labs & imaging results that were available during my care of the patient were reviewed by me and considered in my medical decision making. ? ?Assessment & Plan:  ?Margarie was seen today for anxiety, depression and hypertension. ? ?Diagnoses and all orders for this visit: ? ?Elevated blood pressure reading in office without diagnosis of hypertension ?-Elevated in office today. Home blood pressure log reviewed and stable.  ?Situational mixed anxiety and depressive disorder ?Will initiated 10 mg escitalopram today and reevaluate in 4 weeks.  ?  ? ?Continue all other maintenance medications. ? ?Follow up plan: ?Return in 4 weeks for recheck  ? ? ?Continue healthy lifestyle choices, including diet (rich in fruits, vegetables, and lean proteins, and low in salt and simple carbohydrates) and  exercise (at least 30 minutes of moderate physical activity daily). ? ?Educational handout given for depression ? ?The above assessment and management plan was discussed with the patient. The patient verbalized unders

## 2022-03-30 ENCOUNTER — Inpatient Hospital Stay: Payer: Medicare Other | Attending: Gynecologic Oncology | Admitting: Gynecologic Oncology

## 2022-03-30 ENCOUNTER — Telehealth: Payer: Self-pay | Admitting: Oncology

## 2022-03-30 ENCOUNTER — Other Ambulatory Visit: Payer: Self-pay

## 2022-03-30 ENCOUNTER — Encounter: Payer: Self-pay | Admitting: Gynecologic Oncology

## 2022-03-30 VITALS — BP 159/74 | HR 63 | Temp 98.0°F | Resp 18 | Ht 63.78 in | Wt 247.0 lb

## 2022-03-30 DIAGNOSIS — C539 Malignant neoplasm of cervix uteri, unspecified: Secondary | ICD-10-CM

## 2022-03-30 DIAGNOSIS — Z8541 Personal history of malignant neoplasm of cervix uteri: Secondary | ICD-10-CM | POA: Diagnosis present

## 2022-03-30 DIAGNOSIS — Z90710 Acquired absence of both cervix and uterus: Secondary | ICD-10-CM | POA: Insufficient documentation

## 2022-03-30 NOTE — Progress Notes (Signed)
Gynecologic Oncology Return Clinic Visit ? ?03/30/2022 ? 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 ?Treatment History: ?Oncology History Overview Note  ?CPS pending ?  ?Malignant neoplasm of cervix ([place])  ?12/08/2021 Surgery  ? Vaginal hysterectomy ?  ?12/18/2021 Pathology Results  ? 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 ?  ?12/26/2021 Imaging  ? CT abdomen and pelvis: 6 mm left para-aortic node, 9 mm right external iliac lymph node.  Second of these is noted to be prominent.  No findings of definitive metastatic disease. ?  ?12/29/2021 Initial Diagnosis  ? Malignant neoplasm of cervix [facility]) ?  ?01/08/2022 Imaging  ? PET: ?IMPRESSION: ?1. No findings to suggest metastatic disease involving the chest, ?abdomen or pelvis. ?2. Diffuse hypermetabolism in the thyroid gland is likely due to ?thyroiditis. ?3. No significant bony findings. ?4. Stable cholelithiasis. ?  ? ?07/21/21: pap - [facility] ?09/19/21: Cervical/endocervical biopsy shows H SIL (CIN-3) and superficial fragments of cervical tissue.  Comment is that due to tissue fragmentation and superficial nature of the tissue, invasive process cannot be excluded. ?12/08/2021: Vaginal hysterectomy and cystoscopy with Dr. Evie Lacks.  Findings at the time of surgery were normal uterus and cervix. ?Pathology: Invasive squamous cell carcinoma, grade 2.  Tumor size 7 mm.  Depth of stromal invasion 2 mm.  Margins uninvolved by carcinoma.  Margins uninvolved by high-grade dysplasia.  LVSI not identified.  High-grade squamous intraepithelial lesion noted to involve the endocervical glands.  Endometrium with benign endometrial type polyp. ? ?Interval History: ?Doing well.  Denies any vaginal bleeding or discharge.  Reports regular bowel and bladder function.   Denies any abdominal or pelvic pain. ? ?Past Medical/Surgical History: ?Past Medical History:  ?Diagnosis Date  ? BMI 38.0-38.9,adult   ? history of anemia   ? Hypothyroidism   ? ? ?Past Surgical History:  ?Procedure Laterality Date  ? APPENDECTOMY    ? CERVICAL CONE BIOPSY    ? hx of  ? GAS INSERTION  02/16/2012  ? Procedure: INSERTION OF GAS;  Surgeon: Hayden Pedro, MD;  Location: [facility];  Service: Ophthalmology;  Laterality: Right;  [id number]  ? PARS PLANA VITRECTOMY W/ SCLERAL BUCKLE  02/16/2012  ? Procedure: PARS PLANA VITRECTOMY WITH LASER FOR MACULAR HOLE;  Surgeon: Hayden Pedro, MD;  Location: [place];  Service: Ophthalmology;  Laterality: Right;  25 GAUGE PPV  ? TONSILLECTOMY    ? TUBAL LIGATION    ? VAGINAL HYSTERECTOMY  12/08/2021  ? ? ?Family History  ?Adopted: Yes  ?Problem Relation Age of Onset  ? Stroke Mother   ? Thyroid disease Mother   ? Hypertension Father   ? Jaundice Father   ? Heart attack Father   ? Thyroid disease Sister   ? Hypertension Brother   ? Thyroid disease Brother   ? Hypertension Brother   ? Thyroid disease Brother   ? Thyroid disease Brother   ? Colon cancer Neg Hx   ? Breast cancer Neg Hx   ? Ovarian cancer Neg Hx   ? Endometrial cancer Neg Hx   ? Pancreatic cancer Neg Hx   ? ? ?Social History  ? ?Socioeconomic History  ? Marital status: Widowed  ?  Spouse name: Not on file  ? Number of children: 3  ?  Years of education: Not on file  ? Highest education level: Not on file  ?Occupational History  ? Occupation: retired  ?Tobacco Use  ? Smoking status: Former  ?  Types: Cigarettes  ?  Quit date: 2007  ?  Years since quitting: 16.2  ? Smokeless tobacco: Not on file  ? Tobacco comments:  ?  stopped smoking 2007  ?Vaping Use  ? Vaping Use: Never used  ?Substance and Sexual Activity  ? Alcohol use: Not Currently  ? Drug use: No  ? Sexual activity: Not Currently  ?Other Topics Concern  ? Not on file  ?Social History Narrative  ? Patient reports that husband passed away on 06-Jan-2022, has 3  sons (healthy)   ? ?Social Determinants of Health  ? ?Financial Resource Strain: Not on file  ?Food Insecurity: Not on file  ?Transportation Needs: Not on file  ?Physical Activity: Not on file  ?Stress: Not on file  ?Social Connections: Not on file  ? ? ?Current Medications: ? ?Current Outpatient Medications:  ?  fexofenadine (ALLEGRA) 180 MG tablet, , Disp: , Rfl:  ?  levothyroxine (SYNTHROID) 112 MCG tablet, Take 1 tablet (112 mcg total) by mouth daily., Disp: 90 tablet, Rfl: 3 ?  escitalopram (LEXAPRO) 10 MG tablet, Take 1 tablet (10 mg total) by mouth daily. (Patient not taking: Reported on 03/30/2022), Disp: 30 tablet, Rfl: 1 ? ?Review of Systems: ?+ allergies ?Denies appetite changes, fevers, chills, fatigue, unexplained weight changes. ?Denies hearing loss, neck lumps or masses, mouth sores, ringing in ears or voice changes. ?Denies cough or wheezing.  Denies shortness of breath. ?Denies chest pain or palpitations. Denies leg swelling. ?Denies abdominal distention, pain, blood in stools, constipation, diarrhea, nausea, vomiting, or early satiety. ?Denies pain with intercourse, dysuria, frequency, hematuria or incontinence. ?Denies hot flashes, pelvic pain, vaginal bleeding or vaginal discharge.   ?Denies joint pain, back pain or muscle pain/cramps. ?Denies itching, rash, or wounds. ?Denies dizziness, headaches, numbness or seizures. ?Denies swollen lymph nodes or glands, denies easy bruising or bleeding. ?Denies anxiety, depression, confusion, or decreased concentration. ? ?Physical Exam: ?BP (!) 159/74   Pulse 63   Temp 98 ?F (36.7 ?C) (Oral)   Resp 18   Ht 5' 3.78" (1.62 m)   Wt 247 lb (112 kg)   SpO2 100%   BMI 42.69 kg/m?  ?General: Alert, oriented, no acute distress. ?HEENT: Normocephalic, atraumatic, sclera anicteric. ?Chest: Clear to auscultation bilaterally.  No wheezes or rhonchi. ?Cardiovascular: Regular rate and rhythm, no murmurs. ?Abdomen: Obese, soft, nontender.  Normoactive bowel sounds.   No masses or hepatosplenomegaly appreciated.   ?Extremities: Grossly normal range of motion.  Warm, well perfused.  No edema bilaterally. ?Skin: No rashes or lesions noted. ?Lymphatics: No cervical, supraclavicular, or inguinal adenopathy. ?GU: Normal appearing external genitalia without erythema, excoriation, or lesions.  Speculum exam reveals mildly atrophic vaginal mucosa, no lesions noted.  No bleeding or discharge.  Cuff intact.  Bimanual exam reveals cuff intact, no nodularity or masses.  Rectovaginal exam confirms findings. ? ?Laboratory & Radiologic Studies: ?None new ? ?Assessment & Plan: ?Taylor Berg is a 66 y.o. 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  ?Imaging negative post-op for metastatic disease. ?CPS was requested, I don't see information in Care Everywhere.  We will have my office reach out again to see if this testing was added. ? ?Patient is overall doing well and is NED on exam today. ? ?Per NCCN surveillance guidelines, we will continue with  surveillance visits  every 3-6 months for 2 years.  Will plan on yearly Pap test.  I recommended that we continue with visits every 3 months for at least a year and then could transition to visits every 6 months.  We discussed signs and symptoms that would be concerning for cancer recurrence, and I have encouraged the patient to call me if she develops any of these before her next scheduled visit ? ?26 minutes of total time was spent for this patient encounter, including preparation, face-to-face counseling with the patient and coordination of care, and documentation of the encounter. ? ?Jeral Pinch, MD  ?Division of Gynecologic Oncology  ?Department of Obstetrics and Gynecology  ?[facility]  ? ?

## 2022-03-30 NOTE — Telephone Encounter (Signed)
Left a message for [facility] at [facility] regarding PD-L1 testing on accession [phone]. ?

## 2022-03-30 NOTE — Patient Instructions (Signed)
It was good to see you today.  I do not see or feel any evidence of cancer recurrence on your exam.  We will continue with visits every 3 months.  If you develop any new or concerning symptoms before your visit with me in July, please call to see me sooner. ?

## 2022-04-13 ENCOUNTER — Telehealth: Payer: Self-pay | Admitting: Family Medicine

## 2022-04-13 NOTE — Telephone Encounter (Signed)
Per michelle notes patient needs to have labs done in three months (3) around June.  ?

## 2022-04-15 ENCOUNTER — Ambulatory Visit: Payer: Medicare Other | Admitting: Family Medicine

## 2022-05-15 ENCOUNTER — Telehealth: Payer: Self-pay | Admitting: Family Medicine

## 2022-05-15 NOTE — Telephone Encounter (Signed)
  Left message for patient to call back and schedule Medicare Annual Wellness Visit (AWV) to be completed by video or phone.  No hx of AWV eligible for AWVI per [facility] as of 12/21/2021  Please schedule at anytime with [place] --- Karle Starch  45 Minutes appointment   Any questions, please call me at [phone]

## 2022-06-04 ENCOUNTER — Ambulatory Visit (INDEPENDENT_AMBULATORY_CARE_PROVIDER_SITE_OTHER): Payer: Medicare Other

## 2022-06-04 VITALS — Wt 242.0 lb

## 2022-06-04 DIAGNOSIS — Z1211 Encounter for screening for malignant neoplasm of colon: Secondary | ICD-10-CM | POA: Diagnosis not present

## 2022-06-04 DIAGNOSIS — Z0000 Encounter for general adult medical examination without abnormal findings: Secondary | ICD-10-CM | POA: Diagnosis not present

## 2022-06-04 NOTE — Patient Instructions (Signed)
Taylor Berg , Thank you for taking time to come for your Medicare Wellness Visit. I appreciate your ongoing commitment to your health goals. Please review the following plan we discussed and let me know if I can assist you in the future.   Screening recommendations/referrals: Colonoscopy: Never done - ordered Cologuard today  Mammogram: Done 2021 with [name] - I made you an appointment with the mobile unit for July 24 @ 1pm Bone Density: Never done - recommended every 2 years for women over 65 - you may have this done at your next visit with Taylor Berg Recommended yearly ophthalmology/optometry visit for glaucoma screening and checkup Recommended yearly dental visit for hygiene and checkup  Vaccinations: declines all at this time Influenza vaccine: recommend every Fall Pneumococcal vaccine: recommend once per lifetime Prevnar-20 Tdap vaccine: recommend every 10 years Shingles vaccine: recommend Shingrix which is 2 doses 2-6 months apart and over 90% effective     Covid-19: recommend 2 doses one month apart with a booster 6 months later   Advanced directives: Advance directive discussed with you today. Even though you declined this today, please call our office should you change your mind, and we can give you the proper paperwork for you to fill out.   Conditions/risks identified: Aim for 30 minutes of exercise or brisk walking, 6-8 glasses of water, and 5 servings of fruits and vegetables each day.   Next appointment: Follow up in one year for your annual wellness visit    Preventive Care 65 Years and Older, Female Preventive care refers to lifestyle choices and visits with your health care provider that can promote health and wellness. What does preventive care include? A yearly physical exam. This is also called an annual well check. Dental exams once or twice a year. Routine eye exams. Ask your health care provider how often you should have your eyes  checked. Personal lifestyle choices, including: Daily care of your teeth and gums. Regular physical activity. Eating a healthy diet. Avoiding tobacco and drug use. Limiting alcohol use. Practicing safe sex. Taking low-dose aspirin every day. Taking vitamin and mineral supplements as recommended by your health care provider. What happens during an annual well check? The services and screenings done by your health care provider during your annual well check will depend on your age, overall health, lifestyle risk factors, and family history of disease. Counseling  Your health care provider may ask you questions about your: Alcohol use. Tobacco use. Drug use. Emotional well-being. Home and relationship well-being. Sexual activity. Eating habits. History of falls. Memory and ability to understand (cognition). Work and work Statistician. Reproductive health. Screening  You may have the following tests or measurements: Height, weight, and BMI. Blood pressure. Lipid and cholesterol levels. These may be checked every 5 years, or more frequently if you are over 66 years old. Skin check. Lung cancer screening. You may have this screening every year starting at age 43 if you have a 30-pack-year history of smoking and currently smoke or have quit within the past 15 years. Fecal occult blood test (FOBT) of the stool. You may have this test every year starting at age 80. Flexible sigmoidoscopy or colonoscopy. You may have a sigmoidoscopy every 5 years or a colonoscopy every 10 years starting at age 7. Hepatitis C blood test. Hepatitis B blood test. Sexually transmitted disease (STD) testing. Diabetes screening. This is done by checking your blood sugar (glucose) after you have not eaten for a while (fasting). You may have  this done every 1-3 years. Bone density scan. This is done to screen for osteoporosis. You may have this done starting at age 59. Mammogram. This may be done every 1-2  years. Talk to your health care provider about how often you should have regular mammograms. Talk with your health care provider about your test results, treatment options, and if necessary, the need for more tests. Vaccines  Your health care provider may recommend certain vaccines, such as: Influenza vaccine. This is recommended every year. Tetanus, diphtheria, and acellular pertussis (Tdap, Td) vaccine. You may need a Td booster every 10 years. Zoster vaccine. You may need this after age 18. Pneumococcal 13-valent conjugate (PCV13) vaccine. One dose is recommended after age 90. Pneumococcal polysaccharide (PPSV23) vaccine. One dose is recommended after age 76. Talk to your health care provider about which screenings and vaccines you need and how often you need them. This information is not intended to replace advice given to you by your health care provider. Make sure you discuss any questions you have with your health care provider. Document Released: 01/03/2016 Document Revised: 08/26/2016 Document Reviewed: 10/08/2015 Elsevier Interactive Patient Education  2017 [place] Prevention in the Home Falls can cause injuries. They can happen to people of all ages. There are many things you can do to make your home safe and to help prevent falls. What can I do on the outside of my home? Regularly fix the edges of walkways and driveways and fix any cracks. Remove anything that might make you trip as you walk through a door, such as a raised step or threshold. Trim any bushes or trees on the path to your home. Use bright outdoor lighting. Clear any walking paths of anything that might make someone trip, such as rocks or tools. Regularly check to see if handrails are loose or broken. Make sure that both sides of any steps have handrails. Any raised decks and porches should have guardrails on the edges. Have any leaves, snow, or ice cleared regularly. Use sand or salt on walking paths  during winter. Clean up any spills in your garage right away. This includes oil or grease spills. What can I do in the bathroom? Use night lights. Install grab bars by the toilet and in the tub and shower. Do not use towel bars as grab bars. Use non-skid mats or decals in the tub or shower. If you need to sit down in the shower, use a plastic, non-slip stool. Keep the floor dry. Clean up any water that spills on the floor as soon as it happens. Remove soap buildup in the tub or shower regularly. Attach bath mats securely with double-sided non-slip rug tape. Do not have throw rugs and other things on the floor that can make you trip. What can I do in the bedroom? Use night lights. Make sure that you have a light by your bed that is easy to reach. Do not use any sheets or blankets that are too big for your bed. They should not hang down onto the floor. Have a firm chair that has side arms. You can use this for support while you get dressed. Do not have throw rugs and other things on the floor that can make you trip. What can I do in the kitchen? Clean up any spills right away. Avoid walking on wet floors. Keep items that you use a lot in easy-to-reach places. If you need to reach something above you, use a strong step stool  that has a grab bar. Keep electrical cords out of the way. Do not use floor polish or wax that makes floors slippery. If you must use wax, use non-skid floor wax. Do not have throw rugs and other things on the floor that can make you trip. What can I do with my stairs? Do not leave any items on the stairs. Make sure that there are handrails on both sides of the stairs and use them. Fix handrails that are broken or loose. Make sure that handrails are as long as the stairways. Check any carpeting to make sure that it is firmly attached to the stairs. Fix any carpet that is loose or worn. Avoid having throw rugs at the top or bottom of the stairs. If you do have throw  rugs, attach them to the floor with carpet tape. Make sure that you have a light switch at the top of the stairs and the bottom of the stairs. If you do not have them, ask someone to add them for you. What else can I do to help prevent falls? Wear shoes that: Do not have high heels. Have rubber bottoms. Are comfortable and fit you well. Are closed at the toe. Do not wear sandals. If you use a stepladder: Make sure that it is fully opened. Do not climb a closed stepladder. Make sure that both sides of the stepladder are locked into place. Ask someone to hold it for you, if possible. Clearly mark and make sure that you can see: Any grab bars or handrails. First and last steps. Where the edge of each step is. Use tools that help you move around (mobility aids) if they are needed. These include: Canes. Walkers. Scooters. Crutches. Turn on the lights when you go into a dark area. Replace any light bulbs as soon as they burn out. Set up your furniture so you have a clear path. Avoid moving your furniture around. If any of your floors are uneven, fix them. If there are any pets around you, be aware of where they are. Review your medicines with your doctor. Some medicines can make you feel dizzy. This can increase your chance of falling. Ask your doctor what other things that you can do to help prevent falls. This information is not intended to replace advice given to you by your health care provider. Make sure you discuss any questions you have with your health care provider. Document Released: 10/03/2009 Document Revised: 05/14/2016 Document Reviewed: 01/11/2015 Elsevier Interactive Patient Education  2017 [employer].

## 2022-06-04 NOTE — Progress Notes (Signed)
Subjective:   Taylor Berg is a 66 y.o. female who presents for Medicare Annual (Initial) preventive examination.  Virtual Visit via Telephone Note  I connected with  Taylor Berg on 06/04/22 at 11:15 AM EDT by telephone and verified that I am speaking with the correct person using two identifiers.  Location: Patient: Home Provider: [facility] Persons participating in the virtual visit: patient/Nurse Health Advisor   I discussed the limitations, risks, security and privacy concerns of performing an evaluation and management service by telephone and the availability of in person appointments. The patient expressed understanding and agreed to proceed.  Interactive audio and video telecommunications were attempted between this nurse and patient, however failed, due to patient having technical difficulties OR patient did not have access to video capability.  We continued and completed visit with audio only.  Some vital signs may be absent or patient reported.   Akaila Rambo E Emaley Applin, LPN   Review of Systems     Cardiac Risk Factors include: advanced age (>16mn, >>6women);dyslipidemia;obesity (BMI >30kg/m2);sedentary lifestyle     Objective:    Today's Vitals   06/04/22 1110  Weight: 242 lb (109.8 kg)   Body mass index is 41.83 kg/m.     06/04/2022   11:20 AM 03/30/2022   11:02 AM 12/29/2021    9:24 AM 02/16/2012    9:40 AM 02/15/2012    4:25 PM  Advanced Directives  Does Patient Have a Medical Advance Directive? No No No  Patient does not have advance directive  Would patient like information on creating a medical advance directive? No - Patient declined No - Patient declined Yes (MAU/Ambulatory/Procedural Areas - Information given)    Pre-existing out of facility DNR order (yellow form or pink MOST form)    No     Current Medications (verified) Outpatient Encounter Medications as of 06/04/2022  Medication Sig   levothyroxine (SYNTHROID) 112 MCG tablet Take 1 tablet (112 mcg total)  by mouth daily.   fexofenadine (ALLEGRA) 180 MG tablet  (Patient not taking: Reported on 06/04/2022)   [DISCONTINUED] escitalopram (LEXAPRO) 10 MG tablet Take 1 tablet (10 mg total) by mouth daily. (Patient not taking: Reported on 03/30/2022)   No facility-administered encounter medications on file as of 06/04/2022.    Allergies (verified) Penicillins, Sulfamethoxazole-trimethoprim, Iodinated contrast media, Latex, Sulfa drugs cross reactors, and Wound dressing adhesive   History: Past Medical History:  Diagnosis Date   BMI 38.0-38.9,adult    history of anemia    Hypothyroidism    Past Surgical History:  Procedure Laterality Date   APPENDECTOMY     CERVICAL CONE BIOPSY     hx of   GAS INSERTION  02/16/2012   Procedure: INSERTION OF GAS;  Surgeon: JHayden Pedro MD;  [facility]  Service: Ophthalmology;  Laterality: Right;  [id number]   PARS PLANA VITRECTOMY W/ SCLERAL BUCKLE  02/16/2012   Procedure: PARS PLANA VITRECTOMY WITH LASER FOR MACULAR HOLE;  Surgeon: JHayden Pedro MD;  [facility] [facility]  Service: Ophthalmology;  Laterality: Right;  25 GAUGE PPV   TONSILLECTOMY     TUBAL LIGATION     VAGINAL HYSTERECTOMY  12/08/2021   Family History  Adopted: Yes  Problem Relation Age of Onset   Stroke Mother    Thyroid disease Mother    Hypertension Father    Jaundice Father    Heart attack Father    Thyroid disease Sister    Hypertension Brother    Thyroid disease Brother  Hypertension Brother    Thyroid disease Brother    Thyroid disease Brother    Colon cancer Neg Hx    Breast cancer Neg Hx    Ovarian cancer Neg Hx    Endometrial cancer Neg Hx    Pancreatic cancer Neg Hx    Social History   Socioeconomic History   Marital status: Widowed    Spouse name: Not on file   Number of children: 3   Years of education: Not on file   Highest education level: Not on file  Occupational History   Occupation: retired  Tobacco Use   Smoking status: Former    Types:  Cigarettes    Quit date: 2007    Years since quitting: 16.4   Smokeless tobacco: Not on file   Tobacco comments:    stopped smoking 2007  Vaping Use   Vaping Use: Never used  Substance and Sexual Activity   Alcohol use: Yes    Alcohol/week: 2.0 standard drinks of alcohol    Types: 2 Shots of liquor per week   Drug use: No   Sexual activity: Not Currently  Other Topics Concern   Not on file  Social History Narrative   Patient reports that husband passed away on 01-08-2022, has 3 sons (healthy)    One son and his wife moved in with her recently   Social Determinants of Health   Financial Resource Strain: Low Risk  (06/04/2022)   Overall Financial Resource Strain (CARDIA)    Difficulty of Paying Living Expenses: Not hard at all  Food Insecurity: No Food Insecurity (06/04/2022)   Hunger Vital Sign    Worried About Running Out of Food in the Last Year: Never true    Faith in the Last Year: Never true  Transportation Needs: No Transportation Needs (06/04/2022)   PRAPARE - Hydrologist (Medical): No    Lack of Transportation (Non-Medical): No  Physical Activity: Insufficiently Active (06/04/2022)   Exercise Vital Sign    Days of Exercise per Week: 3 days    Minutes of Exercise per Session: 30 min  Stress: No Stress Concern Present (06/04/2022)   [place]    Feeling of Stress : Only a little  Social Connections: Moderately Integrated (06/04/2022)   Social Connection and Isolation Panel [NHANES]    Frequency of Communication with Friends and Family: More than three times a week    Frequency of Social Gatherings with Friends and Family: More than three times a week    Attends Religious Services: More than 4 times per year    Active Member of [employer] or Organizations: Yes    Attends Archivist Meetings: More than 4 times per year    Marital Status: Widowed    Tobacco  Counseling Counseling given: Not Answered Tobacco comments: stopped smoking 2007   Clinical Intake:  Pre-visit preparation completed: Yes  Pain : No/denies pain     BMI - recorded: 41.83 Nutritional Status: BMI > 30  Obese Nutritional Risks: None Diabetes: No  How often do you need to have someone help you when you read instructions, pamphlets, or other written materials from your doctor or pharmacy?: 1 - Never  Diabetic? no  Interpreter Needed?: No  Information entered by :: Liliane Mallis, LPN   Activities of Daily Living    06/04/2022   11:19 AM  In your present state of health, do you have any  difficulty performing the following activities:  Hearing? 0  Vision? 0  Difficulty concentrating or making decisions? 0  Walking or climbing stairs? 1  Comment hurts knees - has to hold on  Dressing or bathing? 0  Doing errands, shopping? 0  Preparing Food and eating ? N  Using the Toilet? N  In the past six months, have you accidently leaked urine? N  Do you have problems with loss of bowel control? N  Managing your Medications? N  Managing your Finances? N  Housekeeping or managing your Housekeeping? N    Patient Care Team: Baruch Gouty, FNP as PCP - General (Family Medicine) Nat Math, MD as Referring Physician (Obstetrics and Gynecology) Lafonda Mosses, MD as Consulting Physician (Gynecologic Oncology)  Indicate any recent Medical Services you may have received from other than [facility] providers in the past year (date may be approximate).     Assessment:   This is a routine wellness examination for Taylor Berg.  Hearing/Vision screen Hearing Screening - Comments:: Denies hearing difficulties   Vision Screening - Comments:: Wears rx glasses - up to date with routine eye exams with Dr Jeananne Rama in [place]  Dietary issues and exercise activities discussed: Current Exercise Habits: The patient does not participate in regular exercise at present, Exercise  limited by: psychological condition(s)   Goals Addressed             This Visit's Progress    Patient Stated       Stay healthy and well - get more active - manage depression better (already improved)       Depression Screen    06/04/2022   11:28 AM 03/18/2022    3:39 PM 02/24/2022   12:04 PM  PHQ 2/9 Scores  PHQ - 2 Score '2 3 2  '$ PHQ- 9 Score '6 7 5    '$ Fall Risk    06/04/2022   11:12 AM 03/18/2022    3:38 PM 02/24/2022   12:04 PM  Fall Risk   Falls in the past year? 0 0 0  Number falls in past yr: 0    Injury with Fall? 0    Risk for fall due to : No Fall Risks    Follow up Falls prevention discussed      [place]:  Any stairs in or around the home? Yes  If so, are there any without handrails? No  Home free of loose throw rugs in walkways, pet beds, electrical cords, etc? Yes  Adequate lighting in your home to reduce risk of falls? Yes   ASSISTIVE DEVICES UTILIZED TO PREVENT FALLS:  Life alert? No  Use of a cane, walker or w/c? No  Grab bars in the bathroom? No  Shower chair or bench in shower? No  Elevated toilet seat or a handicapped toilet? No   TIMED UP AND GO:  Was the test performed? No . Telephonic visit  Cognitive Function:        06/04/2022   11:29 AM  6CIT Screen  What Year? 0 points  What month? 0 points  What time? 0 points  Count back from 20 0 points  Months in reverse 0 points  Repeat phrase 2 points  Total Score 2 points    Immunizations  There is no immunization history on file for this patient.  TDAP status: Due, Education has been provided regarding the importance of this vaccine. Advised may receive this vaccine at local pharmacy or Health Dept. Aware  to provide a copy of the vaccination record if obtained from local pharmacy or Health Dept. Verbalized acceptance and understanding.  Flu Vaccine status: Declined, Education has been provided regarding the importance of this vaccine but patient  still declined. Advised may receive this vaccine at local pharmacy or Health Dept. Aware to provide a copy of the vaccination record if obtained from local pharmacy or Health Dept. Verbalized acceptance and understanding.  Pneumococcal vaccine status: Declined,  Education has been provided regarding the importance of this vaccine but patient still declined. Advised may receive this vaccine at local pharmacy or Health Dept. Aware to provide a copy of the vaccination record if obtained from local pharmacy or Health Dept. Verbalized acceptance and understanding.   Covid-19 vaccine status: Declined, Education has been provided regarding the importance of this vaccine but patient still declined. Advised may receive this vaccine at local pharmacy or Health Dept.or vaccine clinic. Aware to provide a copy of the vaccination record if obtained from local pharmacy or Health Dept. Verbalized acceptance and understanding.  Qualifies for Shingles Vaccine? Yes   Zostavax completed No   Shingrix Completed?: No.    Education has been provided regarding the importance of this vaccine. Patient has been advised to call insurance company to determine out of pocket expense if they have not yet received this vaccine. Advised may also receive vaccine at local pharmacy or Health Dept. Verbalized acceptance and understanding.  Screening Tests Health Maintenance  Topic Date Due   COVID-19 Vaccine (1) Never done   Zoster Vaccines- Shingrix (1 of 2) Never done   MAMMOGRAM  Never done   DEXA SCAN  Never done   Pneumonia Vaccine 38+ Years old (1 - PCV) 02/25/2023 (Originally 01/10/2021)   TETANUS/TDAP  02/25/2023 (Originally 01/10/1975)   Hepatitis C Screening  02/25/2023 (Originally 01/10/1974)   COLONOSCOPY (Pts 45-47yr Insurance coverage will need to be confirmed)  03/19/2023 (Originally 01/10/2001)   INFLUENZA VACCINE  07/21/2022   HPV VACCINES  Aged Out    Health Maintenance  Health Maintenance Due  Topic Date Due    COVID-19 Vaccine (1) Never done   Zoster Vaccines- Shingrix (1 of 2) Never done   MAMMOGRAM  Never done   DEXA SCAN  Never done    Colorectal Cancer Screening: never done - ordered Cologuard today as requested  Mammogram status: Completed 2021. Repeat every year - made appt for next month with mobile unit  DEXA: never done - recommended at next visit  Lung Cancer Screening: (Low Dose CT Chest recommended if Age 66-80years, 30 pack-year currently smoking OR have quit w/in 15years.) does not qualify.   Additional Screening:  Hepatitis C Screening: does qualify; DUE  Vision Screening: Recommended annual ophthalmology exams for early detection of glaucoma and other disorders of the eye. Is the patient up to date with their annual eye exam?  Yes  Who is the provider or what is the name of the office in which the patient attends annual eye exams? Dr CJeananne Ramain MFlowellaIf pt is not established with a provider, would they like to be referred to a provider to establish care? No .   Dental Screening: Recommended annual dental exams for proper oral hygiene  Community Resource Referral / Chronic Care Management: CRR required this visit?  No   CCM required this visit?  No      Plan:     I have personally reviewed and noted the following in the patient's chart:   Medical and social  history Use of alcohol, tobacco or illicit drugs  Current medications and supplements including opioid prescriptions.  Functional ability and status Nutritional status Physical activity Advanced directives List of other physicians Hospitalizations, surgeries, and ER visits in previous 12 months Vitals Screenings to include cognitive, depression, and falls Referrals and appointments  In addition, I have reviewed and discussed with patient certain preventive protocols, quality metrics, and best practice recommendations. A written personalized care plan for preventive services as well as general  preventive health recommendations were provided to patient.     Sandrea Hammond, LPN   [id number]   Nurse Notes: Had a tooth infection 04/22/22 - had to take Clindamycin 150 for 2 weeks - FYI

## 2022-06-16 ENCOUNTER — Other Ambulatory Visit: Payer: Self-pay

## 2022-06-16 DIAGNOSIS — Z1231 Encounter for screening mammogram for malignant neoplasm of breast: Secondary | ICD-10-CM

## 2022-06-17 ENCOUNTER — Ambulatory Visit (INDEPENDENT_AMBULATORY_CARE_PROVIDER_SITE_OTHER): Payer: Medicare Other | Admitting: Family Medicine

## 2022-06-17 ENCOUNTER — Ambulatory Visit (INDEPENDENT_AMBULATORY_CARE_PROVIDER_SITE_OTHER): Payer: Medicare Other

## 2022-06-17 ENCOUNTER — Encounter: Payer: Self-pay | Admitting: Family Medicine

## 2022-06-17 VITALS — BP 128/86 | HR 61 | Temp 98.6°F | Ht 63.75 in | Wt 243.0 lb

## 2022-06-17 DIAGNOSIS — Z780 Asymptomatic menopausal state: Secondary | ICD-10-CM

## 2022-06-17 DIAGNOSIS — E7800 Pure hypercholesterolemia, unspecified: Secondary | ICD-10-CM | POA: Diagnosis not present

## 2022-06-17 DIAGNOSIS — E039 Hypothyroidism, unspecified: Secondary | ICD-10-CM

## 2022-06-17 DIAGNOSIS — Z13820 Encounter for screening for osteoporosis: Secondary | ICD-10-CM | POA: Diagnosis not present

## 2022-06-17 NOTE — Progress Notes (Signed)
Subjective:  Patient ID: Taylor Berg, female    DOB: 1956-08-27, 66 y.o.   MRN: [id number]  Patient Care Team: Baruch Gouty, FNP as PCP - General (Family Medicine) Nat Math, MD as Referring Physician (Obstetrics and Gynecology) Lafonda Mosses, MD as Consulting Physician (Gynecologic Oncology)   Chief Complaint:  Medical Management of Chronic Issues   HPI: Taylor Berg is a 66 y.o. female presenting on 06/17/2022 for Medical Management of Chronic Issues   Pt presents today for reevaluation after increasing levothyroxine dosing. At last visit, TSH was 10 and dosing was increased to 112 mcg daily. She has been taking on an empty stomach daily as prescribed. States she feels much better overall with the dose increase. No hyper- or hypothyroid symptoms reported. Her TC and LDL were also elevated. She is not on statin therapy. She has made dietary and exercise changes.     Relevant past medical, surgical, family, and social history reviewed and updated as indicated.  Allergies and medications reviewed and updated. Data reviewed: Chart in Epic.   Past Medical History:  Diagnosis Date   BMI 38.0-38.9,adult    history of anemia    Hypothyroidism     Past Surgical History:  Procedure Laterality Date   APPENDECTOMY     CERVICAL CONE BIOPSY     hx of   GAS INSERTION  02/16/2012   Procedure: INSERTION OF GAS;  Surgeon: Hayden Pedro, MD;  Location: [facility];  Service: Ophthalmology;  Laterality: Right;  C3F8   PARS PLANA VITRECTOMY W/ SCLERAL BUCKLE  02/16/2012   Procedure: PARS PLANA VITRECTOMY WITH LASER FOR MACULAR HOLE;  Surgeon: Hayden Pedro, MD;  Location: [facility];  Service: Ophthalmology;  Laterality: Right;  25 GAUGE PPV   TONSILLECTOMY     TUBAL LIGATION     VAGINAL HYSTERECTOMY  12/08/2021    Social History   Socioeconomic History   Marital status: Widowed    Spouse name: Not on file   Number of children: 3   Years of education: Not on file    Highest education level: Not on file  Occupational History   Occupation: retired  Tobacco Use   Smoking status: Former    Types: Cigarettes    Quit date: 2007    Years since quitting: 16.4   Smokeless tobacco: Not on file   Tobacco comments:    stopped smoking 2007  Vaping Use   Vaping Use: Never used  Substance and Sexual Activity   Alcohol use: Yes    Alcohol/week: 2.0 standard drinks of alcohol    Types: 2 Shots of liquor per week   Drug use: No   Sexual activity: Not Currently  Other Topics Concern   Not on file  Social History Narrative   Patient reports that husband passed away on 2022-01-05, has 3 sons (healthy)    One son and his wife moved in with her recently   Social Determinants of Health   Financial Resource Strain: Low Risk  (06/04/2022)   Overall Financial Resource Strain (CARDIA)    Difficulty of Paying Living Expenses: Not hard at all  Food Insecurity: No Food Insecurity (06/04/2022)   Hunger Vital Sign    Worried About Running Out of Food in the Last Year: Never true    [place] in the Last Year: Never true  Transportation Needs: No Transportation Needs (06/04/2022)   PRAPARE - Hydrologist (Medical): No  Lack of Transportation (Non-Medical): No  Physical Activity: Insufficiently Active (06/04/2022)   Exercise Vital Sign    Days of Exercise per Week: 3 days    Minutes of Exercise per Session: 30 min  Stress: No Stress Concern Present (06/04/2022)   [place]    Feeling of Stress : Only a little  Social Connections: Moderately Integrated (06/04/2022)   Social Connection and Isolation Panel [NHANES]    Frequency of Communication with Friends and Family: More than three times a week    Frequency of Social Gatherings with Friends and Family: More than three times a week    Attends Religious Services: More than 4 times per year    Active Member of [employer] or  Organizations: Yes    Attends Archivist Meetings: More than 4 times per year    Marital Status: Widowed  Intimate Partner Violence: Not At Risk (06/04/2022)   Humiliation, Afraid, Rape, and Kick questionnaire    Fear of Current or Ex-Partner: No    Emotionally Abused: No    Physically Abused: No    Sexually Abused: No    Outpatient Encounter Medications as of 06/17/2022  Medication Sig   fexofenadine (ALLEGRA) 180 MG tablet    levothyroxine (SYNTHROID) 112 MCG tablet Take 1 tablet (112 mcg total) by mouth daily.   No facility-administered encounter medications on file as of 06/17/2022.    Allergies  Allergen Reactions   Penicillins Anaphylaxis   Sulfamethoxazole-Trimethoprim Hives and Rash   Iodinated Contrast Media Rash    Under breast and groin rash that developed after IV and PO contrast for CT scan in 12/2021   Latex Rash   Sulfa Drugs Cross Reactors Hives and Rash   Wound Dressing Adhesive Rash    bandaids cause skin breakout    Review of Systems  Constitutional:  Negative for activity change, appetite change, chills, diaphoresis, fatigue, fever and unexpected weight change.  HENT: Negative.    Eyes: Negative.  Negative for photophobia and visual disturbance.  Respiratory:  Negative for cough, chest tightness and shortness of breath.   Cardiovascular:  Negative for chest pain, palpitations and leg swelling.  Gastrointestinal:  Negative for abdominal pain, blood in stool, constipation, diarrhea, nausea and vomiting.  Endocrine: Negative.  Negative for cold intolerance and heat intolerance.  Genitourinary:  Negative for decreased urine volume, difficulty urinating, dysuria, frequency and urgency.  Musculoskeletal:  Negative for arthralgias and myalgias.  Skin: Negative.   Allergic/Immunologic: Negative.   Neurological:  Negative for dizziness, tremors, seizures, syncope, facial asymmetry, speech difficulty, weakness, light-headedness, numbness and headaches.   Hematological: Negative.   Psychiatric/Behavioral:  Negative for confusion, hallucinations, sleep disturbance and suicidal ideas.   All other systems reviewed and are negative.       Objective:  BP 128/86 Comment: home reading  Pulse 61   Temp 98.6 F (37 C)   Ht 5' 3.75" (1.619 m)   Wt 243 lb (110.2 kg)   SpO2 98%   BMI 42.04 kg/m    Wt Readings from Last 3 Encounters:  06/17/22 243 lb (110.2 kg)  06/04/22 242 lb (109.8 kg)  03/30/22 247 lb (112 kg)    Physical Exam Vitals and nursing note reviewed.  Constitutional:      General: She is not in acute distress.    Appearance: Normal appearance. She is obese. She is not ill-appearing, toxic-appearing or diaphoretic.  HENT:     Head: Normocephalic and atraumatic.  Eyes:  Pupils: Pupils are equal, round, and reactive to light.  Cardiovascular:     Rate and Rhythm: Normal rate and regular rhythm.     Heart sounds: Normal heart sounds. No murmur heard.    No friction rub. No gallop.  Pulmonary:     Effort: Pulmonary effort is normal.     Breath sounds: Normal breath sounds.  Musculoskeletal:     Cervical back: Neck supple.     Right lower leg: No edema.     Left lower leg: No edema.  Skin:    General: Skin is warm and dry.     Capillary Refill: Capillary refill takes less than 2 seconds.  Neurological:     General: No focal deficit present.     Mental Status: She is alert and oriented to person, place, and time.  Psychiatric:        Mood and Affect: Mood normal.        Behavior: Behavior normal.        Thought Content: Thought content normal.        Judgment: Judgment normal.     Results for orders placed or performed in visit on 02/24/22  CBC with Differential/Platelet  Result Value Ref Range   WBC 4.3 3.4 - 10.8 x10E3/uL   RBC 4.27 3.77 - 5.28 x10E6/uL   Hemoglobin 13.2 11.1 - 15.9 g/dL   Hematocrit 38.9 34.0 - 46.6 %   MCV 91 79 - 97 fL   MCH 30.9 26.6 - 33.0 pg   MCHC 33.9 31.5 - 35.7 g/dL   RDW  13.9 11.7 - 15.4 %   Platelets 270 150 - 450 x10E3/uL   Neutrophils 55 Not Estab. %   Lymphs 31 Not Estab. %   Monocytes 10 Not Estab. %   Eos 3 Not Estab. %   Basos 1 Not Estab. %   Neutrophils Absolute 2.4 1.4 - 7.0 x10E3/uL   Lymphocytes Absolute 1.3 0.7 - 3.1 x10E3/uL   Monocytes Absolute 0.4 0.1 - 0.9 x10E3/uL   EOS (ABSOLUTE) 0.1 0.0 - 0.4 x10E3/uL   Basophils Absolute 0.1 0.0 - 0.2 x10E3/uL   Immature Granulocytes 0 Not Estab. %   Immature Grans (Abs) 0.0 0.0 - 0.1 x10E3/uL  CMP14+EGFR  Result Value Ref Range   Glucose 85 70 - 99 mg/dL   BUN 14 8 - 27 mg/dL   Creatinine, Ser 0.87 0.57 - 1.00 mg/dL   eGFR 73 >59 mL/min/1.73   BUN/Creatinine Ratio 16 12 - 28   Sodium 142 134 - 144 mmol/L   Potassium 4.7 3.5 - 5.2 mmol/L   Chloride 103 96 - 106 mmol/L   CO2 22 20 - 29 mmol/L   Calcium 9.7 8.7 - 10.3 mg/dL   Total Protein 7.0 6.0 - 8.5 g/dL   Albumin 4.5 3.8 - 4.8 g/dL   Globulin, Total 2.5 1.5 - 4.5 g/dL   Albumin/Globulin Ratio 1.8 1.2 - 2.2   Bilirubin Total 0.5 0.0 - 1.2 mg/dL   Alkaline Phosphatase 87 44 - 121 IU/L   AST 21 0 - 40 IU/L   ALT 15 0 - 32 IU/L  Lipid panel  Result Value Ref Range   Cholesterol, Total 238 (H) 100 - 199 mg/dL   Triglycerides 76 0 - 149 mg/dL   HDL 71 >39 mg/dL   VLDL Cholesterol Cal 13 5 - 40 mg/dL   LDL Chol Calc (NIH) 154 (H) 0 - 99 mg/dL   Chol/HDL Ratio 3.4 0.0 - 4.4 ratio  Thyroid Panel With TSH  Result Value Ref Range   TSH 10.000 (H) 0.450 - 4.500 uIU/mL   T4, Total 8.6 4.5 - 12.0 ug/dL   T3 Uptake Ratio 50 (H) 24 - 39 %   Free Thyroxine Index 4.3 1.2 - 4.9       Pertinent labs & imaging results that were available during my care of the patient were reviewed by me and considered in my medical decision making.  Assessment & Plan:  [place] was seen today for medical management of chronic issues.  Diagnoses and all orders for this visit:  Acquired hypothyroidism Thyroid disease has been poorly controlled. Labs are  pending. Adjustments to regimen will be made if warranted. Make sure to take medications on an empty stomach with a full glass of water. Make sure to avoid vitamins or supplements for at least 4 hours before and 4 hours after taking medications. Repeat labs in 3 months if adjustments are made and in 6 months if stable.   -     Thyroid Panel With TSH  Pure hypercholesterolemia Labs pending, diet and exercise encouraged. Will start medications if warranted.  -     Lipid panel  Encounter for osteoporosis screening in asymptomatic postmenopausal patient Weight bearing exercises and calcium supplementation discussed in detail. DEXA pending, will start additional therapy if warranted.  -     DG [facility] DEXA; Future     Continue all other maintenance medications.  Follow up plan: Return in about 6 months (around 12/17/2022), or if symptoms worsen or fail to improve, for thyroid.   Continue healthy lifestyle choices, including diet (rich in fruits, vegetables, and lean proteins, and low in salt and simple carbohydrates) and exercise (at least 30 minutes of moderate physical activity daily).  Educational handout given for health maintenance  The above assessment and management plan was discussed with the patient. The patient verbalized understanding of and has agreed to the management plan. Patient is aware to call the clinic if they develop any new symptoms or if symptoms persist or worsen. Patient is aware when to return to the clinic for a follow-up visit. Patient educated on when it is appropriate to go to the emergency department.   Monia Pouch, FNP-C [facility] [phone]

## 2022-06-18 LAB — LIPID PANEL
Chol/HDL Ratio: 3.3 ratio (ref 0.0–4.4)
Cholesterol, Total: 222 mg/dL — ABNORMAL HIGH (ref 100–199)
HDL: 68 mg/dL (ref >39)
LDL Chol Calc (NIH): 140 mg/dL — ABNORMAL HIGH (ref 0–99)
Triglycerides: 79 mg/dL (ref 0–149)
VLDL Cholesterol Cal: 14 mg/dL (ref 5–40)

## 2022-06-18 LAB — THYROID PANEL WITH TSH
Free Thyroxine Index: >5.2 — ABNORMAL HIGH (ref 1.2–4.9)
T3 Uptake Ratio: >56 % — ABNORMAL HIGH (ref 24–39)
T4, Total: 9.2 ug/dL (ref 4.5–12.0)
TSH: 7.67 u[IU]/mL — ABNORMAL HIGH (ref 0.450–4.500)

## 2022-06-18 MED ORDER — LEVOTHYROXINE SODIUM 125 MCG PO TABS
125.0000 ug | ORAL_TABLET | Freq: Every day | ORAL | 3 refills | Status: DC
Start: 2022-06-18 — End: 2023-03-10

## 2022-06-18 NOTE — Addendum Note (Signed)
Addended by: Baruch Gouty on: 06/18/2022 09:36 AM   Modules accepted: Orders

## 2022-06-25 ENCOUNTER — Encounter: Payer: Self-pay | Admitting: Gynecologic Oncology

## 2022-06-29 ENCOUNTER — Other Ambulatory Visit: Payer: Self-pay

## 2022-06-29 ENCOUNTER — Encounter: Payer: Self-pay | Admitting: Gynecologic Oncology

## 2022-06-29 ENCOUNTER — Inpatient Hospital Stay: Payer: Medicare Other | Attending: Gynecologic Oncology | Admitting: Gynecologic Oncology

## 2022-06-29 VITALS — BP 140/81 | HR 75 | Temp 98.3°F | Resp 18 | Ht 63.0 in | Wt 235.0 lb

## 2022-06-29 DIAGNOSIS — Z8541 Personal history of malignant neoplasm of cervix uteri: Secondary | ICD-10-CM | POA: Diagnosis present

## 2022-06-29 DIAGNOSIS — Z08 Encounter for follow-up examination after completed treatment for malignant neoplasm: Secondary | ICD-10-CM | POA: Insufficient documentation

## 2022-06-29 DIAGNOSIS — Z8041 Family history of malignant neoplasm of ovary: Secondary | ICD-10-CM | POA: Insufficient documentation

## 2022-06-29 DIAGNOSIS — C539 Malignant neoplasm of cervix uteri, unspecified: Secondary | ICD-10-CM

## 2022-06-29 NOTE — Patient Instructions (Signed)
It was good to see you today.  I do not see or feel any evidence of cancer recurrence on your exam.  I will see you for follow-up in 3 months.  As always, if you develop any new and concerning symptoms before your next visit, please call to see me sooner.  

## 2022-06-29 NOTE — Progress Notes (Addendum)
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  Second of these is noted to be prominent.  No findings of definitive metastatic disease.   12/29/2021 Initial Diagnosis   Malignant neoplasm of cervix ([place])   01/08/2022 Imaging   PET: IMPRESSION: 1. No findings to suggest metastatic disease involving the chest, abdomen or pelvis. 2. Diffuse hypermetabolism in the thyroid gland is likely due to thyroiditis. 3. No significant bony findings. 4. Stable cholelithiasis.    07/21/21: pap - [facility]IL 09/19/21: Cervical/endocervical biopsy shows H SIL (CIN-3) and superficial fragments of cervical tissue.  Comment is that due to tissue fragmentation and superficial nature of the tissue, invasive process cannot be excluded. 12/08/2021: Vaginal hysterectomy and cystoscopy with Dr. Evie Lacks.  Findings at the time of surgery were normal uterus and cervix. Pathology: Invasive squamous cell carcinoma, grade 2.  Tumor size 7 mm.  Depth of stromal invasion 2 mm.  Margins uninvolved by carcinoma.  Margins uninvolved by high-grade dysplasia.  LVSI not identified.  High-grade squamous intraepithelial lesion noted to involve the endocervical glands.  Endometrium with benign endometrial type polyp.  Interval History: Patient reports doing well.  Denies any vaginal bleeding or discharge.  Reports regular bowel bladder  function.  Has made some diet changes, has lost several pounds.  Denies any abdominal or pelvic pain.  Mother-in-law is on treatment currently for cancer.  Her birthday was this past weekend.  They will be traveling down to see her in [place] in August to celebrate.  Past Medical/Surgical History: Past Medical History:  Diagnosis Date   BMI 38.0-38.9,adult    history of anemia    Hypothyroidism     Past Surgical History:  Procedure Laterality Date   APPENDECTOMY     CERVICAL CONE BIOPSY     hx of   GAS INSERTION  02/16/2012   Procedure: INSERTION OF GAS;  Surgeon: Hayden Pedro, MD;  Location: [facility];  Service: Ophthalmology;  Laterality: Right;  C3F8   PARS PLANA VITRECTOMY W/ SCLERAL BUCKLE  02/16/2012   Procedure: PARS PLANA VITRECTOMY WITH LASER FOR MACULAR HOLE;  Surgeon: Hayden Pedro, MD;  Location: [facility];  Service: Ophthalmology;  Laterality: Right;  25 GAUGE PPV   TONSILLECTOMY     TUBAL LIGATION     VAGINAL HYSTERECTOMY  12/08/2021    Family History  Adopted: Yes  Problem Relation Age of Onset   Stroke Mother    Thyroid disease Mother    Hypertension Father    Jaundice Father    Heart attack Father    Thyroid disease Sister    Hypertension Brother    Thyroid disease Brother    Hypertension Brother    Thyroid disease Brother    Thyroid disease Brother    Colon cancer Neg Hx    Breast cancer Neg Hx    Ovarian cancer Neg Hx  Endometrial cancer Neg Hx    Pancreatic cancer Neg Hx     Social History   Socioeconomic History   Marital status: Widowed    Spouse name: Not on file   Number of children: 3   Years of education: Not on file   Highest education level: Not on file  Occupational History   Occupation: retired  Tobacco Use   Smoking status: Former    Types: Cigarettes    Quit date: 2007    Years since quitting: 16.5   Smokeless tobacco: Not on file   Tobacco comments:    stopped smoking 2007  Vaping Use   Vaping Use: Never used   Substance and Sexual Activity   Alcohol use: Yes    Alcohol/week: 2.0 standard drinks of alcohol    Types: 2 Shots of liquor per week   Drug use: No   Sexual activity: Not Currently  Other Topics Concern   Not on file  Social History Narrative   Patient reports that husband passed away on 12-23-2021, has 3 sons (healthy)    One son and his wife moved in with her recently   Social Determinants of Health   Financial Resource Strain: Low Risk  (06/04/2022)   Overall Financial Resource Strain (CARDIA)    Difficulty of Paying Living Expenses: Not hard at all  Food Insecurity: No Food Insecurity (06/04/2022)   Hunger Vital Sign    Worried About Running Out of Food in the Last Year: Never true    [place] in the Last Year: Never true  Transportation Needs: No Transportation Needs (06/04/2022)   PRAPARE - Hydrologist (Medical): No    Lack of Transportation (Non-Medical): No  Physical Activity: Insufficiently Active (06/04/2022)   Exercise Vital Sign    Days of Exercise per Week: 3 days    Minutes of Exercise per Session: 30 min  Stress: No Stress Concern Present (06/04/2022)   [place]    Feeling of Stress : Only a little  Social Connections: Moderately Integrated (06/04/2022)   Social Connection and Isolation Panel [NHANES]    Frequency of Communication with Friends and Family: More than three times a week    Frequency of Social Gatherings with Friends and Family: More than three times a week    Attends Religious Services: More than 4 times per year    Active Member of [employer] or Organizations: Yes    Attends Archivist Meetings: More than 4 times per year    Marital Status: Widowed    Current Medications:  Current Outpatient Medications:    levothyroxine (SYNTHROID) 125 MCG tablet, Take 1 tablet (125 mcg total) by mouth daily., Disp: 90 tablet, Rfl: 3   fexofenadine  (ALLEGRA) 180 MG tablet, , Disp: , Rfl:   Review of Systems: Denies appetite changes, fevers, chills, fatigue, unexplained weight changes. Denies hearing loss, neck lumps or masses, mouth sores, ringing in ears or voice changes. Denies cough or wheezing.  Denies shortness of breath. Denies chest pain or palpitations. Denies leg swelling. Denies abdominal distention, pain, blood in stools, constipation, diarrhea, nausea, vomiting, or early satiety. Denies pain with intercourse, dysuria, frequency, hematuria or incontinence. Denies hot flashes, pelvic pain, vaginal bleeding or vaginal discharge.   Denies joint pain, back pain or muscle pain/cramps. Denies itching, rash, or wounds. Denies dizziness, headaches, numbness or seizures. Denies swollen lymph nodes or glands, denies easy bruising or bleeding.  Denies anxiety, depression, confusion, or decreased concentration.  Physical Exam: BP 140/81 (BP Location: Right Arm, Patient Position: Sitting)   Pulse 75   Temp 98.3 F (36.8 C) (Oral)   Resp 18   Ht '5\' 3"'$  (1.6 m)   Wt 235 lb (106.6 kg)   SpO2 99%   BMI 41.63 kg/m  General: Alert, oriented, no acute distress. HEENT: Normocephalic, atraumatic, sclera anicteric. Chest: Clear to auscultation bilaterally.  No wheezes or rhonchi. Cardiovascular: Regular rate and rhythm, no murmurs. Abdomen: Obese, soft, nontender.  Normoactive bowel sounds.  No masses or hepatosplenomegaly appreciated.   Extremities: Grossly normal range of motion.  Warm, well perfused.  No edema bilaterally. Skin: No rashes or lesions noted. Lymphatics: No cervical, supraclavicular, or inguinal adenopathy. GU: Normal appearing external genitalia without erythema, excoriation, or lesions.  Speculum exam reveals mildly atrophic vaginal mucosa, no lesions noted.  No bleeding or discharge.  Cuff intact.  Bimanual exam reveals cuff intact, no nodularity or masses.  Rectovaginal exam confirms findings.  Laboratory &  Radiologic Studies: None new  Assessment & Plan: Taylor Berg is a 66 y.o. 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 Surgery 11/2021. Imaging negative post-op for metastatic disease. CPS 1%.   Patient is overall doing well and is NED on exam today.   Per NCCN surveillance guidelines, we will continue with surveillance visits  every 3-6 months for 2 years.  Will plan on yearly Pap test.  I recommended that we continue with visits every 3 months for at least a year and then could transition to visits every 6 months.  We will discuss at her next visit whether she would like to transition to visits every 6 months or continue with visits every 3 months.    We discussed signs and symptoms that would be concerning for cancer recurrence, and I have encouraged the patient to call me if she develops any of these before her next scheduled visit  20 minutes of total time was spent for this patient encounter, including preparation, face-to-face counseling with the patient and coordination of care, and documentation of the encounter.  Jeral Pinch, MD  Division of Gynecologic Oncology  Department of Obstetrics and Gynecology  [facility]

## 2022-08-14 ENCOUNTER — Ambulatory Visit
Admission: RE | Admit: 2022-08-14 | Discharge: 2022-08-14 | Disposition: A | Discharge status: Home / Self Care | Payer: Medicare Other | Source: Ambulatory Visit | Attending: Family Medicine | Admitting: Family Medicine

## 2022-08-14 DIAGNOSIS — Z1231 Encounter for screening mammogram for malignant neoplasm of breast: Secondary | ICD-10-CM

## 2022-09-08 ENCOUNTER — Encounter: Payer: Self-pay | Admitting: Family Medicine

## 2022-09-08 ENCOUNTER — Ambulatory Visit (INDEPENDENT_AMBULATORY_CARE_PROVIDER_SITE_OTHER): Payer: Medicare Other | Admitting: Family Medicine

## 2022-09-08 VITALS — BP 124/78 | HR 64 | Temp 98.3°F | Ht 63.0 in | Wt 241.0 lb

## 2022-09-08 DIAGNOSIS — E039 Hypothyroidism, unspecified: Secondary | ICD-10-CM | POA: Diagnosis not present

## 2022-09-08 NOTE — Progress Notes (Signed)
Subjective:  Patient ID: Taylor Berg, female    DOB: 09-17-1956, 66 y.o.   MRN: [id number]  Patient Care Team: Baruch Gouty, FNP as PCP - General (Family Medicine) Nat Math, MD as Referring Physician (Obstetrics and Gynecology) Lafonda Mosses, MD as Consulting Physician (Gynecologic Oncology)   Chief Complaint:  Hypothyroidism   HPI: AMANTHA Berg is a 66 y.o. female presenting on 09/08/2022 for Hypothyroidism   Pt presents today for follow up after increasing her levothyroxine dose  months ago. She is currently on 125 mcg daily. She is compliant   Thyroid Problem Presents for follow-up visit. Patient reports no anxiety, cold intolerance, constipation, depressed mood, diaphoresis, diarrhea, dry skin, fatigue, hair loss, heat intolerance, hoarse voice, leg swelling, nail problem, palpitations, tremors, visual change, weight gain or weight loss. The symptoms have been improving.    Relevant past medical, surgical, family, and social history reviewed and updated as indicated.  Allergies and medications reviewed and updated. Data reviewed: Chart in Epic.   Past Medical History:  Diagnosis Date   BMI 38.0-38.9,adult    history of anemia    Hypothyroidism     Past Surgical History:  Procedure Laterality Date   APPENDECTOMY     CERVICAL CONE BIOPSY     hx of   GAS INSERTION  02/16/2012   Procedure: INSERTION OF GAS;  Surgeon: Hayden Pedro, MD;  Location: [place];  Service: Ophthalmology;  Laterality: Right;  C3F8   PARS PLANA VITRECTOMY W/ SCLERAL BUCKLE  02/16/2012   Procedure: PARS PLANA VITRECTOMY WITH LASER FOR MACULAR HOLE;  Surgeon: Hayden Pedro, MD;  Location: [place];  Service: Ophthalmology;  Laterality: Right;  25 GAUGE PPV   TONSILLECTOMY     TUBAL LIGATION     VAGINAL HYSTERECTOMY  12/08/2021    Social History   Socioeconomic History   Marital status: Widowed    Spouse name: Not on file   Number of children: 3   Years of education: Not  on file   Highest education level: Not on file  Occupational History   Occupation: retired  Tobacco Use   Smoking status: Former    Types: Cigarettes    Quit date: 2007    Years since quitting: 16.7   Smokeless tobacco: Not on file   Tobacco comments:    stopped smoking 2007  Vaping Use   Vaping Use: Never used  Substance and Sexual Activity   Alcohol use: Yes    Alcohol/week: 2.0 standard drinks of alcohol    Types: 2 Shots of liquor per week   Drug use: No   Sexual activity: Not Currently  Other Topics Concern   Not on file  Social History Narrative   Patient reports that husband passed away on January 04, 2022, has 3 sons (healthy)    One son and his wife moved in with her recently   Social Determinants of Health   Financial Resource Strain: Low Risk  (06/04/2022)   Overall Financial Resource Strain (CARDIA)    Difficulty of Paying Living Expenses: Not hard at all  Food Insecurity: No Food Insecurity (06/04/2022)   Hunger Vital Sign    Worried About Running Out of Food in the Last Year: Never true    [place] in the Last Year: Never true  Transportation Needs: No Transportation Needs (06/04/2022)   PRAPARE - Hydrologist (Medical): No    Lack of Transportation (Non-Medical): No  Physical Activity: Insufficiently Active (06/04/2022)   Exercise Vital Sign    Days of Exercise per Week: 3 days    Minutes of Exercise per Session: 30 min  Stress: No Stress Concern Present (06/04/2022)   [place]    Feeling of Stress : Only a little  Social Connections: Moderately Integrated (06/04/2022)   Social Connection and Isolation Panel [NHANES]    Frequency of Communication with Friends and Family: More than three times a week    Frequency of Social Gatherings with Friends and Family: More than three times a week    Attends Religious Services: More than 4 times per year    Active Member of  [employer] or Organizations: Yes    Attends Archivist Meetings: More than 4 times per year    Marital Status: Widowed  Intimate Partner Violence: Not At Risk (06/04/2022)   Humiliation, Afraid, Rape, and Kick questionnaire    Fear of Current or Ex-Partner: No    Emotionally Abused: No    Physically Abused: No    Sexually Abused: No    Outpatient Encounter Medications as of 09/08/2022  Medication Sig   levothyroxine (SYNTHROID) 125 MCG tablet Take 1 tablet (125 mcg total) by mouth daily.   fexofenadine (ALLEGRA) 180 MG tablet  (Patient not taking: Reported on 09/08/2022)   No facility-administered encounter medications on file as of 09/08/2022.    Allergies  Allergen Reactions   Penicillins Anaphylaxis   Sulfamethoxazole-Trimethoprim Hives and Rash   Iodinated Contrast Media Rash    Under breast and groin rash that developed after IV and PO contrast for CT scan in 12/2021   Latex Rash   Sulfa Drugs Cross Reactors Hives and Rash   Wound Dressing Adhesive Rash    bandaids cause skin breakout    Review of Systems  Constitutional:  Negative for activity change, appetite change, chills, diaphoresis, fatigue, fever, unexpected weight change, weight gain and weight loss.  HENT:  Negative for hoarse voice.   Respiratory:  Negative for cough and shortness of breath.   Cardiovascular:  Negative for chest pain, palpitations and leg swelling.  Gastrointestinal:  Negative for abdominal pain, constipation and diarrhea.  Endocrine: Negative for cold intolerance and heat intolerance.  Genitourinary:  Negative for decreased urine volume and difficulty urinating.  Musculoskeletal:  Negative for arthralgias and myalgias.  Neurological:  Negative for dizziness, tremors, facial asymmetry, weakness, light-headedness and headaches.  Psychiatric/Behavioral:  Negative for confusion. The patient is not nervous/anxious.   All other systems reviewed and are negative.       Objective:  BP (!)  148/77   Pulse 64   Temp 98.3 F (36.8 C)   Ht '5\' 3"'$  (1.6 m)   Wt 241 lb (109.3 kg)   SpO2 96%   BMI 42.69 kg/m    Wt Readings from Last 3 Encounters:  09/08/22 241 lb (109.3 kg)  06/29/22 235 lb (106.6 kg)  06/17/22 243 lb (110.2 kg)    Physical Exam Vitals and nursing note reviewed.  Constitutional:      General: She is not in acute distress.    Appearance: Normal appearance. She is obese. She is not ill-appearing, toxic-appearing or diaphoretic.  HENT:     Head: Normocephalic and atraumatic.     Nose: Nose normal.     Mouth/Throat:     Mouth: Mucous membranes are moist.  Eyes:     Conjunctiva/sclera: Conjunctivae normal.     Pupils: Pupils  are equal, round, and reactive to light.  Neck:     Thyroid: No thyroid mass, thyromegaly or thyroid tenderness.  Cardiovascular:     Rate and Rhythm: Normal rate and regular rhythm.     Heart sounds: Normal heart sounds.  Pulmonary:     Effort: Pulmonary effort is normal.     Breath sounds: Normal breath sounds.  Musculoskeletal:     Cervical back: Normal range of motion and neck supple.     Right lower leg: No edema.     Left lower leg: No edema.  Skin:    General: Skin is warm and dry.  Neurological:     General: No focal deficit present.     Mental Status: She is alert and oriented to person, place, and time.  Psychiatric:        Mood and Affect: Mood normal.        Behavior: Behavior normal.        Thought Content: Thought content normal.        Judgment: Judgment normal.     Results for orders placed or performed in visit on 06/17/22  Thyroid Panel With TSH  Result Value Ref Range   TSH 7.670 (H) 0.450 - 4.500 uIU/mL   T4, Total 9.2 4.5 - 12.0 ug/dL   T3 Uptake Ratio >56 (H) 24 - 39 %   Free Thyroxine Index >5.2 (H) 1.2 - 4.9  Lipid panel  Result Value Ref Range   Cholesterol, Total 222 (H) 100 - 199 mg/dL   Triglycerides 79 0 - 149 mg/dL   HDL 68 >39 mg/dL   VLDL Cholesterol Cal 14 5 - 40 mg/dL   LDL Chol  Calc (NIH) 140 (H) 0 - 99 mg/dL   Chol/HDL Ratio 3.3 0.0 - 4.4 ratio       Pertinent labs & imaging results that were available during my care of the patient were reviewed by me and considered in my medical decision making.  Assessment & Plan:  [place] was seen today for hypothyroidism.  Diagnoses and all orders for this visit:  Acquired hypothyroidism Thyroid disease has been fairly controlled, synthroid increased after last visit. Labs are pending. Adjustments to regimen will be made if warranted. Make sure to take medications on an empty stomach with a full glass of water. Make sure to avoid vitamins or supplements for at least 4 hours before and 4 hours after taking medications. Repeat labs in 3 months if adjustments are made and in 6 months if stable.   -     Thyroid Panel With TSH     Continue all other maintenance medications.  Follow up plan: Return in about 6 months (around 03/09/2023), or if symptoms worsen or fail to improve, for thyroid, lipids.   Continue healthy lifestyle choices, including diet (rich in fruits, vegetables, and lean proteins, and low in salt and simple carbohydrates) and exercise (at least 30 minutes of moderate physical activity daily).  Educational handout given for hypothyroidism  The above assessment and management plan was discussed with the patient. The patient verbalized understanding of and has agreed to the management plan. Patient is aware to call the clinic if they develop any new symptoms or if symptoms persist or worsen. Patient is aware when to return to the clinic for a follow-up visit. Patient educated on when it is appropriate to go to the emergency department.   Monia Pouch, FNP-C [facility] [phone]

## 2022-09-09 LAB — THYROID PANEL WITH TSH
Free Thyroxine Index: >5 — ABNORMAL HIGH (ref 1.2–4.9)
T3 Uptake Ratio: >56 % — ABNORMAL HIGH (ref 24–39)
T4, Total: 8.9 ug/dL (ref 4.5–12.0)
TSH: 4.68 u[IU]/mL — ABNORMAL HIGH (ref 0.450–4.500)

## 2022-09-23 ENCOUNTER — Encounter: Payer: Self-pay | Admitting: Gynecologic Oncology

## 2022-09-25 ENCOUNTER — Inpatient Hospital Stay: Payer: Medicare Other | Attending: Gynecologic Oncology | Admitting: Gynecologic Oncology

## 2022-09-25 VITALS — BP 131/66 | HR 85 | Temp 98.1°F | Resp 18 | Ht 63.0 in | Wt 244.0 lb

## 2022-09-25 DIAGNOSIS — Z90710 Acquired absence of both cervix and uterus: Secondary | ICD-10-CM | POA: Insufficient documentation

## 2022-09-25 DIAGNOSIS — Z8541 Personal history of malignant neoplasm of cervix uteri: Secondary | ICD-10-CM | POA: Insufficient documentation

## 2022-09-25 DIAGNOSIS — C539 Malignant neoplasm of cervix uteri, unspecified: Secondary | ICD-10-CM

## 2022-09-25 NOTE — Patient Instructions (Signed)
It was good to see you today.  I do not see or feel any evidence of cancer recurrence on your exam.  I will see you for follow-up in 3 months.  We will do a Pap test at that visit.  As always, if you develop any new and concerning symptoms before your next visit, please call to see me sooner.

## 2022-09-25 NOTE — Progress Notes (Signed)
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  Second of these is noted to be prominent.  No findings of definitive metastatic disease.   12/29/2021 Initial Diagnosis   Malignant neoplasm of cervix ([place])   01/08/2022 Imaging   PET: IMPRESSION: 1. No findings to suggest metastatic disease involving the chest, abdomen or pelvis. 2. Diffuse hypermetabolism in the thyroid gland is likely due to thyroiditis. 3. No significant bony findings. 4. Stable cholelithiasis.    07/21/21: pap - [facility] 09/19/21: Cervical/endocervical biopsy shows H SIL (CIN-3) and superficial fragments of cervical tissue.  Comment is that due to tissue fragmentation and superficial nature of the tissue, invasive process cannot be excluded. 12/08/2021: Vaginal hysterectomy and cystoscopy with Dr. Evie Lacks.  Findings at the time of surgery were normal uterus and cervix. Pathology: Invasive squamous cell carcinoma, grade 2.  Tumor size 7 mm.  Depth of stromal invasion 2 mm.  Margins uninvolved by carcinoma.  Margins uninvolved by high-grade dysplasia.  LVSI not identified.  High-grade squamous intraepithelial lesion noted to involve the endocervical glands.  Endometrium with benign endometrial type polyp.  Interval History: Patient reports doing well.  She denies any vaginal bleeding or discharge.  She reports baseline bowel and bladder function.  Denies any abdominal or  pelvic pain.  Patient is enjoying her new puppy, Bandit.  Past Medical/Surgical History: Past Medical History:  Diagnosis Date   BMI 38.0-38.9,adult    history of anemia    Hypothyroidism     Past Surgical History:  Procedure Laterality Date   APPENDECTOMY     CERVICAL CONE BIOPSY     hx of   GAS INSERTION  02/16/2012   Procedure: INSERTION OF GAS;  Surgeon: Hayden Pedro, MD;  Location: [place];  Service: Ophthalmology;  Laterality: Right;  C3F8   PARS PLANA VITRECTOMY W/ SCLERAL BUCKLE  02/16/2012   Procedure: PARS PLANA VITRECTOMY WITH LASER FOR MACULAR HOLE;  Surgeon: Hayden Pedro, MD;  Location: [place];  Service: Ophthalmology;  Laterality: Right;  25 GAUGE PPV   TONSILLECTOMY     TUBAL LIGATION     VAGINAL HYSTERECTOMY  12/08/2021    Family History  Adopted: Yes  Problem Relation Age of Onset   Stroke Mother    Thyroid disease Mother    Hypertension Father    Jaundice Father    Heart attack Father    Thyroid disease Sister    Hypertension Brother    Thyroid disease Brother    Hypertension Brother    Thyroid disease Brother    Thyroid disease Brother    Colon cancer Neg Hx    Breast cancer Neg Hx    Ovarian cancer Neg Hx    Endometrial cancer Neg Hx    Pancreatic cancer Neg Hx     Social History   Socioeconomic History   Marital status: Widowed    Spouse name: Not on file  Number of children: 3   Years of education: Not on file   Highest education level: Not on file  Occupational History   Occupation: retired  Tobacco Use   Smoking status: Former    Types: Cigarettes    Quit date: 2007    Years since quitting: 16.7   Smokeless tobacco: Not on file   Tobacco comments:    stopped smoking 2007  Vaping Use   Vaping Use: Never used  Substance and Sexual Activity   Alcohol use: Yes    Alcohol/week: 2.0 standard drinks of alcohol    Types: 2 Shots of liquor per week   Drug use: No   Sexual activity: Not Currently  Other Topics Concern   Not  on file  Social History Narrative   Patient reports that husband passed away on 01/09/22, has 3 sons (healthy)    One son and his wife moved in with her recently   Social Determinants of Health   Financial Resource Strain: Low Risk  (06/04/2022)   Overall Financial Resource Strain (CARDIA)    Difficulty of Paying Living Expenses: Not hard at all  Food Insecurity: No Food Insecurity (06/04/2022)   Hunger Vital Sign    Worried About Running Out of Food in the Last Year: Never true    [place] in the Last Year: Never true  Transportation Needs: No Transportation Needs (06/04/2022)   PRAPARE - Hydrologist (Medical): No    Lack of Transportation (Non-Medical): No  Physical Activity: Insufficiently Active (06/04/2022)   Exercise Vital Sign    Days of Exercise per Week: 3 days    Minutes of Exercise per Session: 30 min  Stress: No Stress Concern Present (06/04/2022)   [place]    Feeling of Stress : Only a little  Social Connections: Moderately Integrated (06/04/2022)   Social Connection and Isolation Panel [NHANES]    Frequency of Communication with Friends and Family: More than three times a week    Frequency of Social Gatherings with Friends and Family: More than three times a week    Attends Religious Services: More than 4 times per year    Active Member of [employer] or Organizations: Yes    Attends Archivist Meetings: More than 4 times per year    Marital Status: Widowed    Current Medications:  Current Outpatient Medications:    levothyroxine (SYNTHROID) 125 MCG tablet, Take 1 tablet (125 mcg total) by mouth daily., Disp: 90 tablet, Rfl: 3  Review of Systems: Denies appetite changes, fevers, chills, fatigue, unexplained weight changes. Denies hearing loss, neck lumps or masses, mouth sores, ringing in ears or voice changes. Denies cough or wheezing.  Denies shortness of  breath. Denies chest pain or palpitations. Denies leg swelling. Denies abdominal distention, pain, blood in stools, constipation, diarrhea, nausea, vomiting, or early satiety. Denies pain with intercourse, dysuria, frequency, hematuria or incontinence. Denies hot flashes, pelvic pain, vaginal bleeding or vaginal discharge.   Denies joint pain, back pain or muscle pain/cramps. Denies itching, rash, or wounds. Denies dizziness, headaches, numbness or seizures. Denies swollen lymph nodes or glands, denies easy bruising or bleeding. Denies anxiety, depression, confusion, or decreased concentration.  Physical Exam: BP 131/66 (BP Location: Left Arm, Patient Position: Sitting)   Pulse 85   Temp 98.1 F (36.7 C) (Oral)   Resp 18   Ht '5\' 3"'$  (1.6 m)   Wt 244 lb (110.7  kg)   BMI 43.22 kg/m  General: Alert, oriented, no acute distress. HEENT: Traumatic, normocephalic, sclera anicteric. Chest: Clear to auscultation bilaterally.  No wheezes or rhonchi. Cardiovascular: Regular rate and rhythm, no murmurs. Abdomen: Obese, soft, nontender.  Normoactive bowel sounds.  No masses or hepatosplenomegaly appreciated.  Extremities: Grossly normal range of motion.  Warm, well perfused.  No edema bilaterally. Skin: No rashes or lesions noted. Lymphatics: No cervical, supraclavicular, or inguinal adenopathy. GU: Normal appearing external genitalia without erythema, excoriation, or lesions.  Speculum exam reveals mildly atrophic vaginal mucosa, cuff intact without lesions.  No blood or discharge within the vaginal vault.  Bimanual exam reveals no masses or nodularity.  Rectovaginal exam confirms findings.  Laboratory & Radiologic Studies: None new  Assessment & Plan: Taylor Berg is a 66 y.o. 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 Surgery 11/2021. Imaging negative post-op for metastatic disease. CPS 1%.   Patient is overall doing well and is NED on exam today.  I will plan to see her back in  3 months for a Pap test.   Per NCCN surveillance guidelines, we will continue with surveillance visits every 3-6 months for 2 years.  Will plan on yearly Pap test.  I recommended that we continue with visits every 3 months for at least a year and then could transition to visits every 6 months.  We will discuss again at her next visit whether she would like to transition to visits every 6 months or continue with visits every 3 months.     We discussed signs and symptoms that would be concerning for cancer recurrence, and I have encouraged the patient to call me if she develops any of these before her next scheduled visit  20 minutes of total time was spent for this patient encounter, including preparation, face-to-face counseling with the patient and coordination of care, and documentation of the encounter.  Jeral Pinch, MD  Division of Gynecologic Oncology  Department of Obstetrics and Gynecology  [facility]

## 2022-12-16 ENCOUNTER — Encounter: Payer: Self-pay | Admitting: Gynecologic Oncology

## 2022-12-18 ENCOUNTER — Encounter: Payer: Self-pay | Admitting: Gynecologic Oncology

## 2022-12-18 ENCOUNTER — Inpatient Hospital Stay: Payer: Medicare Other | Attending: Gynecologic Oncology | Admitting: Gynecologic Oncology

## 2022-12-18 ENCOUNTER — Other Ambulatory Visit: Payer: Self-pay

## 2022-12-18 ENCOUNTER — Ambulatory Visit: Payer: Medicare Other | Admitting: Family Medicine

## 2022-12-18 ENCOUNTER — Other Ambulatory Visit (HOSPITAL_COMMUNITY)
Admission: RE | Admit: 2022-12-18 | Discharge: 2022-12-18 | Disposition: A | Discharge status: Home / Self Care | Payer: Medicare Other | Source: Ambulatory Visit | Attending: Gynecologic Oncology | Admitting: Gynecologic Oncology

## 2022-12-18 VITALS — BP 163/76 | HR 72 | Temp 98.5°F | Resp 18 | Ht 63.0 in | Wt 249.0 lb

## 2022-12-18 DIAGNOSIS — Z01419 Encounter for gynecological examination (general) (routine) without abnormal findings: Secondary | ICD-10-CM | POA: Insufficient documentation

## 2022-12-18 DIAGNOSIS — Z90722 Acquired absence of ovaries, bilateral: Secondary | ICD-10-CM | POA: Diagnosis not present

## 2022-12-18 DIAGNOSIS — Z8541 Personal history of malignant neoplasm of cervix uteri: Secondary | ICD-10-CM | POA: Insufficient documentation

## 2022-12-18 DIAGNOSIS — Z1151 Encounter for screening for human papillomavirus (HPV): Secondary | ICD-10-CM | POA: Diagnosis not present

## 2022-12-18 DIAGNOSIS — C539 Malignant neoplasm of cervix uteri, unspecified: Secondary | ICD-10-CM | POA: Insufficient documentation

## 2022-12-18 DIAGNOSIS — Z90710 Acquired absence of both cervix and uterus: Secondary | ICD-10-CM | POA: Insufficient documentation

## 2022-12-18 NOTE — Patient Instructions (Signed)
It was good to see you today.  I do not see or feel any evidence of cancer recurrence on your exam.  I will see you for follow-up in 6 months.  As always, if you develop any new and concerning symptoms before your next visit, please call to see me sooner.   

## 2022-12-18 NOTE — Progress Notes (Signed)
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  Second of these is noted to be prominent.  No findings of definitive metastatic disease.   12/29/2021 Initial Diagnosis   Malignant neoplasm of cervix ([place])   01/08/2022 Imaging   PET: IMPRESSION: 1. No findings to suggest metastatic disease involving the chest, abdomen or pelvis. 2. Diffuse hypermetabolism in the thyroid gland is likely due to thyroiditis. 3. No significant bony findings. 4. Stable cholelithiasis.    07/21/21: pap - HSIL 09/19/21: Cervical/endocervical biopsy shows H SIL (CIN-3) and superficial fragments of cervical tissue.  Comment is that due to tissue fragmentation and superficial nature of the tissue, invasive process cannot be excluded. 12/08/2021: Vaginal hysterectomy and cystoscopy with Dr. Evie Lacks.  Findings at the time of surgery were normal uterus and cervix. Pathology: Invasive squamous cell carcinoma, grade 2.  Tumor size 7 mm.  Depth of stromal invasion 2 mm.  Margins uninvolved by carcinoma.  Margins uninvolved by high-grade dysplasia.  LVSI not identified.  High-grade squamous intraepithelial lesion noted to involve the endocervical glands.  Endometrium with benign endometrial type polyp.  Interval History: Doing well.  Denies any abdominal or pelvic pain.  Denies any vaginal bleeding or discharge.  Reports baseline bowel bladder function.  Sister is  in town for [employer].    Past Medical/Surgical History: Past Medical History:  Diagnosis Date   BMI 38.0-38.9,adult    history of anemia    Hypothyroidism     Past Surgical History:  Procedure Laterality Date   APPENDECTOMY     CERVICAL CONE BIOPSY     hx of   GAS INSERTION  02/16/2012   Procedure: INSERTION OF GAS;  Surgeon: Hayden Pedro, MD;  Location: [place];  Service: Ophthalmology;  Laterality: Right;  C3F8   PARS PLANA VITRECTOMY W/ SCLERAL BUCKLE  02/16/2012   Procedure: PARS PLANA VITRECTOMY WITH LASER FOR MACULAR HOLE;  Surgeon: Hayden Pedro, MD;  Location: [place];  Service: Ophthalmology;  Laterality: Right;  25 GAUGE PPV   TONSILLECTOMY     TUBAL LIGATION     VAGINAL HYSTERECTOMY  12/08/2021    Family History  Adopted: Yes  Problem Relation Age of Onset   Stroke Mother    Thyroid disease Mother    Hypertension Father    Jaundice Father    Heart attack Father    Thyroid disease Sister    Hypertension Brother    Thyroid disease Brother    Hypertension Brother    Thyroid disease Brother    Thyroid disease Brother    Colon cancer Neg Hx    Breast cancer Neg Hx    Ovarian cancer Neg Hx    Endometrial cancer Neg Hx    Pancreatic cancer Neg Hx     Social History   Socioeconomic History   Marital status: Widowed    Spouse name: Not on file   Number of  children: 3   Years of education: Not on file   Highest education level: Not on file  Occupational History   Occupation: retired  Tobacco Use   Smoking status: Former    Types: Cigarettes    Quit date: 2007    Years since quitting: 17.0   Smokeless tobacco: Not on file   Tobacco comments:    stopped smoking 2007  Vaping Use   Vaping Use: Never used  Substance and Sexual Activity   Alcohol use: Yes    Alcohol/week: 2.0 standard drinks of alcohol    Types: 2 Shots of liquor per week   Drug use: No   Sexual activity: Not Currently  Other Topics Concern   Not on file  Social History Narrative    Patient reports that husband passed away on 12/23/2021, has 3 sons (healthy)    One son and his wife moved in with her recently   Social Determinants of Health   Financial Resource Strain: Low Risk  (06/04/2022)   Overall Financial Resource Strain (CARDIA)    Difficulty of Paying Living Expenses: Not hard at all  Food Insecurity: No Food Insecurity (06/04/2022)   Hunger Vital Sign    Worried About Running Out of Food in the Last Year: Never true    [place] in the Last Year: Never true  Transportation Needs: No Transportation Needs (06/04/2022)   PRAPARE - Hydrologist (Medical): No    Lack of Transportation (Non-Medical): No  Physical Activity: Insufficiently Active (06/04/2022)   Exercise Vital Sign    Days of Exercise per Week: 3 days    Minutes of Exercise per Session: 30 min  Stress: No Stress Concern Present (06/04/2022)   [place]    Feeling of Stress : Only a little  Social Connections: Moderately Integrated (06/04/2022)   Social Connection and Isolation Panel [NHANES]    Frequency of Communication with Friends and Family: More than three times a week    Frequency of Social Gatherings with Friends and Family: More than three times a week    Attends Religious Services: More than 4 times per year    Active Member of [employer] or Organizations: Yes    Attends Archivist Meetings: More than 4 times per year    Marital Status: Widowed    Current Medications:  Current Outpatient Medications:    levothyroxine (SYNTHROID) 125 MCG tablet, Take 1 tablet (125 mcg total) by mouth daily., Disp: 90 tablet, Rfl: 3  Review of Systems: Denies appetite changes, fevers, chills, fatigue, unexplained weight changes. Denies hearing loss, neck lumps or masses, mouth sores, ringing in ears or voice changes. Denies cough or wheezing.  Denies shortness of breath. Denies chest pain or  palpitations. Denies leg swelling. Denies abdominal distention, pain, blood in stools, constipation, diarrhea, nausea, vomiting, or early satiety. Denies pain with intercourse, dysuria, frequency, hematuria or incontinence. Denies hot flashes, pelvic pain, vaginal bleeding or vaginal discharge.   Denies joint pain, back pain or muscle pain/cramps. Denies itching, rash, or wounds. Denies dizziness, headaches, numbness or seizures. Denies swollen lymph nodes or glands, denies easy bruising or bleeding. Denies anxiety, depression, confusion, or decreased concentration.  Physical Exam: BP (!) 163/76 (BP Location: Right Arm, Patient Position: Sitting)   Pulse 72   Temp 98.5 F (36.9 C) (Oral)   Resp 18   Ht '5\' 3"'$  (1.6 m)   Wt 249 lb (112.9 kg)  SpO2 100%   BMI 44.11 kg/m  General: Alert, oriented, no acute distress. HEENT: Traumatic, normocephalic, sclera anicteric. Chest: Clear to auscultation bilaterally.  No wheezes or rhonchi. Cardiovascular: Regular rate and rhythm, no murmurs. Abdomen: Obese, soft, nontender.  Normoactive bowel sounds.  No masses or hepatosplenomegaly appreciated.  Extremities: Grossly normal range of motion.  Warm, well perfused.  No edema bilaterally. Skin: No rashes or lesions noted. Lymphatics: No cervical, supraclavicular, or inguinal adenopathy. GU: Normal appearing external genitalia without erythema, excoriation, or lesions.  Speculum exam reveals mildly atrophic vaginal mucosa, cuff intact without lesions.  No blood or discharge within the vaginal vault. Pap and HPV collected. Bimanual exam reveals no masses or nodularity.  Rectovaginal exam confirms findings.  Laboratory & Radiologic Studies: None new  Assessment & Plan: RAFEEF LAU is a 66 y.o. 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 Surgery 11/2021. Imaging negative post-op for metastatic disease. CPS 1%.   Patient is overall doing well and is NED on exam today.  Pap and HPV performed  today.   Per NCCN surveillance guidelines, we will continue with surveillance visits every 3-6 months for 2 years.  Discussed again today possibility of transitioning to visits every 6 months.  This is her preference.  I will see her again in June.    Will plan on yearly Pap test.    We discussed signs and symptoms that would be concerning for cancer recurrence, and I have encouraged the patient to call me if she develops any of these before her next scheduled visit  20 minutes of total time was spent for this patient encounter, including preparation, face-to-face counseling with the patient and coordination of care, and documentation of the encounter.  Jeral Pinch, MD  Division of Gynecologic Oncology  Department of Obstetrics and Gynecology  [facility]

## 2022-12-24 LAB — CYTOLOGY - PAP
Comment: NEGATIVE
Diagnosis: REACTIVE
High risk HPV: NEGATIVE

## 2023-03-09 ENCOUNTER — Encounter: Payer: Self-pay | Admitting: Family Medicine

## 2023-03-09 ENCOUNTER — Ambulatory Visit (INDEPENDENT_AMBULATORY_CARE_PROVIDER_SITE_OTHER): Payer: Medicare Other | Admitting: Family Medicine

## 2023-03-09 VITALS — BP 129/77 | HR 65 | Temp 97.8°F | Ht 63.0 in | Wt 254.8 lb

## 2023-03-09 DIAGNOSIS — F4323 Adjustment disorder with mixed anxiety and depressed mood: Secondary | ICD-10-CM | POA: Diagnosis not present

## 2023-03-09 DIAGNOSIS — E559 Vitamin D deficiency, unspecified: Secondary | ICD-10-CM | POA: Diagnosis not present

## 2023-03-09 DIAGNOSIS — E039 Hypothyroidism, unspecified: Secondary | ICD-10-CM

## 2023-03-09 DIAGNOSIS — E782 Mixed hyperlipidemia: Secondary | ICD-10-CM

## 2023-03-09 LAB — BAYER DCA HB A1C WAIVED: HB A1C (BAYER DCA - WAIVED): 5.2 % (ref 4.8–5.6)

## 2023-03-09 NOTE — Progress Notes (Signed)
Subjective:  Patient ID: Taylor Berg, female    DOB: 08-06-1956, 67 y.o.   MRN: [id number]  Patient Care Team: Baruch Gouty, FNP as PCP - General (Family Medicine) Nat Math, MD as Referring Physician (Obstetrics and Gynecology) Lafonda Mosses, MD as Consulting Physician (Gynecologic Oncology)   Chief Complaint:  Thyroid Problem (6 month follow up )   HPI: Taylor Berg is a 67 y.o. female presenting on 03/09/2023 for Thyroid Problem (6 month follow up )   1. Acquired hypothyroidism Compliant with medications - Yes Current medications - levothyroxine 125 mcg daily Adverse side effects - No Weight - gaining  Bowel habit changes - No Heat or cold intolerance - No Mood changes - Yes Changes in sleep habits - No Fatigue - Yes Skin, hair, or nail changes - No Tremor - No Palpitations - No Edema - No Shortness of breath - No  2. Morbid obesity ([place]) Has not been as active or following a healthy diet. States she is working on being more active and changing her diet.   3. Vitamin D deficiency Not on repletion therapy. Denies arthralgias, recent fractures, or trouble walking.   4. Situational mixed anxiety and depressive disorder Still symptomatic but states gradually improving. Does not wish to start medications. Denies SI or HI.     03/09/2023    2:08 PM 09/08/2022    2:09 PM 06/17/2022   11:02 AM 06/04/2022   11:28 AM 03/18/2022    3:39 PM  Depression screen PHQ 2/9  Decreased Interest 0 1 0 1 2  Down, Depressed, Hopeless 0 0 0 1 1  PHQ - 2 Score 0 1 0 2 3  Altered sleeping 0 0 0 1 1  Tired, decreased energy 0 0 0 1 1  Change in appetite 0 1 0 1 1  Feeling bad or failure about yourself  0 0 0 1 1  Trouble concentrating 0 0 0 0 0  Moving slowly or fidgety/restless 0 0 0 0 0  Suicidal thoughts 0  0 0 0  PHQ-9 Score 0 2 0 6 7  Difficult doing work/chores Not difficult at all Not difficult at all Not difficult at all Somewhat difficult Somewhat  difficult      03/09/2023    2:08 PM 09/08/2022    2:10 PM 06/17/2022   11:02 AM 03/18/2022    3:39 PM  GAD 7 : Generalized Anxiety Score  Nervous, Anxious, on Edge 0 0 0 1  Control/stop worrying 0 0 0 1  Worry too much - different things 0 0 0 0  Trouble relaxing 0 0 0 1  Restless 0 0 0 0  Easily annoyed or irritable 0 0 0 0  Afraid - awful might happen 0 0 0 0  Total GAD 7 Score 0 0 0 3  Anxiety Difficulty Not difficult at all Not difficult at all Not difficult at all Not difficult at all         Relevant past medical, surgical, family, and social history reviewed and updated as indicated.  Allergies and medications reviewed and updated. Data reviewed: Chart in Epic.   Past Medical History:  Diagnosis Date   BMI 38.0-38.9,adult    history of anemia    Hypothyroidism     Past Surgical History:  Procedure Laterality Date   APPENDECTOMY     CERVICAL CONE BIOPSY     hx of   GAS INSERTION  02/16/2012   Procedure: INSERTION  OF GAS;  Surgeon: Hayden Pedro, MD;  Location: [place];  Service: Ophthalmology;  Laterality: Right;  C3F8   PARS PLANA VITRECTOMY W/ SCLERAL BUCKLE  02/16/2012   Procedure: PARS PLANA VITRECTOMY WITH LASER FOR MACULAR HOLE;  Surgeon: Hayden Pedro, MD;  Location: [place];  Service: Ophthalmology;  Laterality: Right;  25 GAUGE PPV   TONSILLECTOMY     TUBAL LIGATION     VAGINAL HYSTERECTOMY  12/08/2021    Social History   Socioeconomic History   Marital status: Widowed    Spouse name: Not on file   Number of children: 3   Years of education: Not on file   Highest education level: Not on file  Occupational History   Occupation: retired  Tobacco Use   Smoking status: Former    Types: Cigarettes    Quit date: 2007    Years since quitting: 17.2   Smokeless tobacco: Not on file   Tobacco comments:    stopped smoking 2007  Vaping Use   Vaping Use: Never used  Substance and Sexual Activity   Alcohol use: Yes    Alcohol/week: 2.0 standard  drinks of alcohol    Types: 2 Shots of liquor per week   Drug use: No   Sexual activity: Not Currently  Other Topics Concern   Not on file  Social History Narrative   Patient reports that husband passed away on January 12, 2022, has 3 sons (healthy)    One son and his wife moved in with her recently   Social Determinants of Health   Financial Resource Strain: Low Risk  (06/04/2022)   Overall Financial Resource Strain (CARDIA)    Difficulty of Paying Living Expenses: Not hard at all  Food Insecurity: No Food Insecurity (06/04/2022)   Hunger Vital Sign    Worried About Running Out of Food in the Last Year: Never true    [place] in the Last Year: Never true  Transportation Needs: No Transportation Needs (06/04/2022)   PRAPARE - Hydrologist (Medical): No    Lack of Transportation (Non-Medical): No  Physical Activity: Insufficiently Active (06/04/2022)   Exercise Vital Sign    Days of Exercise per Week: 3 days    Minutes of Exercise per Session: 30 min  Stress: No Stress Concern Present (06/04/2022)   [place]    Feeling of Stress : Only a little  Social Connections: Moderately Integrated (06/04/2022)   Social Connection and Isolation Panel [NHANES]    Frequency of Communication with Friends and Family: More than three times a week    Frequency of Social Gatherings with Friends and Family: More than three times a week    Attends Religious Services: More than 4 times per year    Active Member of [employer] or Organizations: Yes    Attends Archivist Meetings: More than 4 times per year    Marital Status: Widowed  Intimate Partner Violence: Not At Risk (06/04/2022)   Humiliation, Afraid, Rape, and Kick questionnaire    Fear of Current or Ex-Partner: No    Emotionally Abused: No    Physically Abused: No    Sexually Abused: No    Outpatient Encounter Medications as of 03/09/2023   Medication Sig   levothyroxine (SYNTHROID) 125 MCG tablet Take 1 tablet (125 mcg total) by mouth daily.   No facility-administered encounter medications on file as of 03/09/2023.    Allergies  Allergen Reactions   Penicillins Anaphylaxis   Sulfamethoxazole-Trimethoprim Hives and Rash   Iodinated Contrast Media Rash    Under breast and groin rash that developed after IV and PO contrast for CT scan in 12/2021   Latex Rash   Sulfa Drugs Cross Reactors Hives and Rash   Wound Dressing Adhesive Rash    bandaids cause skin breakout    Review of Systems  Constitutional:  Positive for activity change and unexpected weight change. Negative for appetite change, chills, diaphoresis, fatigue and fever.  HENT: Negative.    Eyes: Negative.  Negative for photophobia and visual disturbance.  Respiratory:  Negative for cough, chest tightness and shortness of breath.   Cardiovascular:  Negative for chest pain, palpitations and leg swelling.  Gastrointestinal:  Negative for abdominal pain, blood in stool, constipation, diarrhea, nausea and vomiting.  Endocrine: Negative.   Genitourinary:  Negative for decreased urine volume, difficulty urinating, dysuria, frequency and urgency.  Musculoskeletal:  Negative for arthralgias and myalgias.  Skin:        Multiple skin tags  Allergic/Immunologic: Negative.   Neurological:  Negative for dizziness, tremors, seizures, syncope, facial asymmetry, speech difficulty, weakness, light-headedness, numbness and headaches.  Hematological: Negative.   Psychiatric/Behavioral:  Negative for confusion, hallucinations, sleep disturbance and suicidal ideas.   All other systems reviewed and are negative.       Objective:  BP 129/77 Comment: at home reading  Pulse 65   Temp 97.8 F (36.6 C) (Temporal)   Ht 5\' 3"  (1.6 m)   Wt 254 lb 12.8 oz (115.6 kg)   SpO2 99%   BMI 45.14 kg/m    Wt Readings from Last 3 Encounters:  03/09/23 254 lb 12.8 oz (115.6 kg)  12/18/22  249 lb (112.9 kg)  09/25/22 244 lb (110.7 kg)    Physical Exam Vitals and nursing note reviewed.  Constitutional:      General: She is not in acute distress.    Appearance: Normal appearance. She is well-developed and well-groomed. She is morbidly obese. She is not ill-appearing, toxic-appearing or diaphoretic.  HENT:     Head: Normocephalic and atraumatic.     Jaw: There is normal jaw occlusion.     Right Ear: Hearing normal.     Left Ear: Hearing normal.     Nose: Nose normal.     Mouth/Throat:     Lips: Pink.     Mouth: Mucous membranes are moist.     Pharynx: Oropharynx is clear. Uvula midline.  Eyes:     General: Lids are normal.     Extraocular Movements: Extraocular movements intact.     Conjunctiva/sclera: Conjunctivae normal.     Pupils: Pupils are equal, round, and reactive to light.  Neck:     Thyroid: No thyroid mass, thyromegaly or thyroid tenderness.     Vascular: No carotid bruit or JVD.     Trachea: Trachea and phonation normal.  Cardiovascular:     Rate and Rhythm: Normal rate and regular rhythm.     Chest Wall: PMI is not displaced.     Pulses: Normal pulses.     Heart sounds: Normal heart sounds. No murmur heard.    No friction rub. No gallop.  Pulmonary:     Effort: Pulmonary effort is normal. No respiratory distress.     Breath sounds: Normal breath sounds. No wheezing.  Abdominal:     General: Bowel sounds are normal. There is no distension or abdominal bruit.     Palpations: Abdomen  is soft. There is no hepatomegaly or splenomegaly.     Tenderness: There is no abdominal tenderness. There is no right CVA tenderness or left CVA tenderness.     Hernia: No hernia is present.  Musculoskeletal:        General: Normal range of motion.     Cervical back: Normal range of motion and neck supple.     Right lower leg: No edema.     Left lower leg: No edema.  Lymphadenopathy:     Cervical: No cervical adenopathy.  Skin:    General: Skin is warm and dry.      Capillary Refill: Capillary refill takes less than 2 seconds.     Coloration: Skin is not cyanotic, jaundiced or pale.     Findings: No rash.       Neurological:     General: No focal deficit present.     Mental Status: She is alert and oriented to person, place, and time.     Sensory: Sensation is intact.     Motor: Motor function is intact.     Coordination: Coordination is intact.     Gait: Gait is intact.     Deep Tendon Reflexes: Reflexes are normal and symmetric.  Psychiatric:        Attention and Perception: Attention and perception normal.        Mood and Affect: Mood and affect normal.        Speech: Speech normal.        Behavior: Behavior normal. Behavior is cooperative.        Thought Content: Thought content normal.        Cognition and Memory: Cognition and memory normal.        Judgment: Judgment normal.     Results for orders placed or performed in visit on 12/18/22  Cytology - PAP  Result Value Ref Range   High risk HPV Negative    Adequacy Satisfactory for evaluation.    Diagnosis - Benign reactive/reparative changes    Comment Normal Reference Range HPV - Negative        Pertinent labs & imaging results that were available during my care of the patient were reviewed by me and considered in my medical decision making.  Assessment & Plan:  Miana was seen today for thyroid problem.  Diagnoses and all orders for this visit:  Acquired hypothyroidism Thyroid disease has been fairly controlled. Labs are pending. Adjustments to regimen will be made if warranted. Make sure to take medications on an empty stomach with a full glass of water. Make sure to avoid vitamins or supplements for at least 4 hours before and 4 hours after taking medications. Repeat labs in 3 months if adjustments are made and in 6 months if stable.   -     Thyroid Panel With TSH  Morbid obesity ([place]) Diet and exercise encouraged. Labs pending.  -     CBC with Differential/Platelet -      CMP14+EGFR -     Thyroid Panel With TSH -     Lipid panel -     VITAMIN D 25 Hydroxy (Vit-D Deficiency, Fractures) -     Bayer DCA Hb A1c Waived  Vitamin D deficiency Labs pending. Eat foods rich in Vit D including milk, orange juice, yogurt with vitamin D added, salmon or mackerel, canned tuna fish, cereals with vitamin D added, and cod liver oil. Get out in the sun but make sure to wear at least  SPF 30 sunscreen.  -     CBC with Differential/Platelet -     VITAMIN D 25 Hydroxy (Vit-D Deficiency, Fractures)  Situational mixed anxiety and depressive disorder States she is still symptomatic at times. Greatly improved. Does not wish to start medications at this time.     Continue all other maintenance medications.  Follow up plan: Return in about 3 months (around 06/09/2023), or if symptoms worsen or fail to improve.   Continue healthy lifestyle choices, including diet (rich in fruits, vegetables, and lean proteins, and low in salt and simple carbohydrates) and exercise (at least 30 minutes of moderate physical activity daily).  Educational handout given for health maintenance   The above assessment and management plan was discussed with the patient. The patient verbalized understanding of and has agreed to the management plan. Patient is aware to call the clinic if they develop any new symptoms or if symptoms persist or worsen. Patient is aware when to return to the clinic for a follow-up visit. Patient educated on when it is appropriate to go to the emergency department.   Monia Pouch, FNP-C [facility] [phone]

## 2023-03-10 LAB — LIPID PANEL
Chol/HDL Ratio: 3.7 ratio (ref 0.0–4.4)
Cholesterol, Total: 239 mg/dL — ABNORMAL HIGH (ref 100–199)
HDL: 64 mg/dL (ref >39)
LDL Chol Calc (NIH): 162 mg/dL — ABNORMAL HIGH (ref 0–99)
Triglycerides: 75 mg/dL (ref 0–149)
VLDL Cholesterol Cal: 13 mg/dL (ref 5–40)

## 2023-03-10 LAB — CBC WITH DIFFERENTIAL/PLATELET
Basophils Absolute: 0.1 10*3/uL (ref 0.0–0.2)
Basos: 1 %
EOS (ABSOLUTE): 0.1 10*3/uL (ref 0.0–0.4)
Eos: 2 %
Hematocrit: 41.2 % (ref 34.0–46.6)
Hemoglobin: 13.7 g/dL (ref 11.1–15.9)
Immature Grans (Abs): 0 10*3/uL (ref 0.0–0.1)
Immature Granulocytes: 0 %
Lymphocytes Absolute: 1.7 10*3/uL (ref 0.7–3.1)
Lymphs: 33 %
MCH: 30.4 pg (ref 26.6–33.0)
MCHC: 33.3 g/dL (ref 31.5–35.7)
MCV: 91 fL (ref 79–97)
Monocytes Absolute: 0.5 10*3/uL (ref 0.1–0.9)
Monocytes: 9 %
Neutrophils Absolute: 2.9 10*3/uL (ref 1.4–7.0)
Neutrophils: 55 %
Platelets: 270 10*3/uL (ref 150–450)
RBC: 4.51 x10E6/uL (ref 3.77–5.28)
RDW: 14.3 % (ref 11.7–15.4)
WBC: 5.3 10*3/uL (ref 3.4–10.8)

## 2023-03-10 LAB — CMP14+EGFR
ALT: 15 IU/L (ref 0–32)
AST: 21 IU/L (ref 0–40)
Albumin/Globulin Ratio: 1.4 (ref 1.2–2.2)
Albumin: 4.2 g/dL (ref 3.9–4.9)
Alkaline Phosphatase: 83 IU/L (ref 44–121)
BUN/Creatinine Ratio: 13 (ref 12–28)
BUN: 10 mg/dL (ref 8–27)
Bilirubin Total: 0.5 mg/dL (ref 0.0–1.2)
CO2: 21 mmol/L (ref 20–29)
Calcium: 9.4 mg/dL (ref 8.7–10.3)
Chloride: 100 mmol/L (ref 96–106)
Creatinine, Ser: 0.78 mg/dL (ref 0.57–1.00)
Globulin, Total: 2.9 g/dL (ref 1.5–4.5)
Glucose: 87 mg/dL (ref 70–99)
Potassium: 4.5 mmol/L (ref 3.5–5.2)
Sodium: 138 mmol/L (ref 134–144)
Total Protein: 7.1 g/dL (ref 6.0–8.5)
eGFR: 83 mL/min/{1.73_m2} (ref >59)

## 2023-03-10 LAB — THYROID PANEL WITH TSH
Free Thyroxine Index: >5 — ABNORMAL HIGH (ref 1.2–4.9)
T3 Uptake Ratio: >56 % — ABNORMAL HIGH (ref 24–39)
T4, Total: 9 ug/dL (ref 4.5–12.0)
TSH: 9.42 u[IU]/mL — ABNORMAL HIGH (ref 0.450–4.500)

## 2023-03-10 LAB — VITAMIN D 25 HYDROXY (VIT D DEFICIENCY, FRACTURES): Vit D, 25-Hydroxy: 19.9 ng/mL — ABNORMAL LOW (ref 30.0–100.0)

## 2023-03-10 MED ORDER — PRAVASTATIN SODIUM 10 MG PO TABS: 10.0000 mg | ORAL_TABLET | Freq: Every day | ORAL | 3 refills | Status: DC

## 2023-03-10 MED ORDER — LEVOTHYROXINE SODIUM 137 MCG PO TABS: 137.0000 ug | ORAL_TABLET | Freq: Every day | ORAL | 1 refills | Status: DC

## 2023-03-10 NOTE — Addendum Note (Signed)
Addended by: Baruch Gouty on: 03/10/2023 08:19 AM   Modules accepted: Orders

## 2023-03-30 ENCOUNTER — Ambulatory Visit: Payer: Medicare Other | Admitting: Family Medicine

## 2023-06-08 ENCOUNTER — Ambulatory Visit (INDEPENDENT_AMBULATORY_CARE_PROVIDER_SITE_OTHER): Payer: Medicare Other

## 2023-06-08 VITALS — Ht 63.0 in | Wt 248.0 lb

## 2023-06-08 DIAGNOSIS — Z0000 Encounter for general adult medical examination without abnormal findings: Secondary | ICD-10-CM | POA: Diagnosis not present

## 2023-06-08 DIAGNOSIS — Z1231 Encounter for screening mammogram for malignant neoplasm of breast: Secondary | ICD-10-CM

## 2023-06-08 DIAGNOSIS — Z1211 Encounter for screening for malignant neoplasm of colon: Secondary | ICD-10-CM

## 2023-06-08 NOTE — Patient Instructions (Signed)
Taylor Berg , Thank you for taking time to come for your Medicare Wellness Visit. I appreciate your ongoing commitment to your health goals. Please review the following plan we discussed and let me know if I can assist you in the future.   These are the goals we discussed:  Goals      DIET - INCREASE WATER INTAKE     Patient Stated     Stay healthy and well - get more active - manage depression better (already improved)        This is a list of the screening recommended for you and due dates:  Health Maintenance  Topic Date Due   COVID-19 Vaccine (1) Never done   DTaP/Tdap/Td vaccine (1 - Tdap) Never done   Colon Cancer Screening  Never done   Medicare Annual Wellness Visit  06/05/2023   Zoster (Shingles) Vaccine (1 of 2) 06/09/2023*   Pneumonia Vaccine (1 of 1 - PCV) 03/08/2024*   Hepatitis C Screening  03/08/2024*   Flu Shot  07/22/2023   Mammogram  08/15/2023   DEXA scan (bone density measurement)  Completed   HPV Vaccine  Aged Out  *Topic was postponed. The date shown is not the original due date.    Advanced directives: Advance directive discussed with you today. I have provided a copy for you to complete at home and have notarized. Once this is complete please bring a copy in to our office so we can scan it into your chart.   Conditions/risks identified: Aim for 30 minutes of exercise or brisk walking, 6-8 glasses of water, and 5 servings of fruits and vegetables each day.   Next appointment: Follow up in one year for your annual wellness visit    Preventive Care 65 Years and Older, Female Preventive care refers to lifestyle choices and visits with your health care provider that can promote health and wellness. What does preventive care include? A yearly physical exam. This is also called an annual well check. Dental exams once or twice a year. Routine eye exams. Ask your health care provider how often you should have your eyes checked. Personal lifestyle choices,  including: Daily care of your teeth and gums. Regular physical activity. Eating a healthy diet. Avoiding tobacco and drug use. Limiting alcohol use. Practicing safe sex. Taking low-dose aspirin every day. Taking vitamin and mineral supplements as recommended by your health care provider. What happens during an annual well check? The services and screenings done by your health care provider during your annual well check will depend on your age, overall health, lifestyle risk factors, and family history of disease. Counseling  Your health care provider may ask you questions about your: Alcohol use. Tobacco use. Drug use. Emotional well-being. Home and relationship well-being. Sexual activity. Eating habits. History of falls. Memory and ability to understand (cognition). Work and work Astronomer. Reproductive health. Screening  You may have the following tests or measurements: Height, weight, and BMI. Blood pressure. Lipid and cholesterol levels. These may be checked every 5 years, or more frequently if you are over 56 years old. Skin check. Lung cancer screening. You may have this screening every year starting at age 54 if you have a 30-pack-year history of smoking and currently smoke or have quit within the past 15 years. Fecal occult blood test (FOBT) of the stool. You may have this test every year starting at age 50. Flexible sigmoidoscopy or colonoscopy. You may have a sigmoidoscopy every 5 years or a colonoscopy every  10 years starting at age 60. Hepatitis C blood test. Hepatitis B blood test. Sexually transmitted disease (STD) testing. Diabetes screening. This is done by checking your blood sugar (glucose) after you have not eaten for a while (fasting). You may have this done every 1-3 years. Bone density scan. This is done to screen for osteoporosis. You may have this done starting at age 57. Mammogram. This may be done every 1-2 years. Talk to your health care provider  about how often you should have regular mammograms. Talk with your health care provider about your test results, treatment options, and if necessary, the need for more tests. Vaccines  Your health care provider may recommend certain vaccines, such as: Influenza vaccine. This is recommended every year. Tetanus, diphtheria, and acellular pertussis (Tdap, Td) vaccine. You may need a Td booster every 10 years. Zoster vaccine. You may need this after age 23. Pneumococcal 13-valent conjugate (PCV13) vaccine. One dose is recommended after age 53. Pneumococcal polysaccharide (PPSV23) vaccine. One dose is recommended after age 60. Talk to your health care provider about which screenings and vaccines you need and how often you need them. This information is not intended to replace advice given to you by your health care provider. Make sure you discuss any questions you have with your health care provider. Document Released: 01/03/2016 Document Revised: 08/26/2016 Document Reviewed: 10/08/2015 Elsevier Interactive Patient Education  2017 [place] Prevention in the Home Falls can cause injuries. They can happen to people of all ages. There are many things you can do to make your home safe and to help prevent falls. What can I do on the outside of my home? Regularly fix the edges of walkways and driveways and fix any cracks. Remove anything that might make you trip as you walk through a door, such as a raised step or threshold. Trim any bushes or trees on the path to your home. Use bright outdoor lighting. Clear any walking paths of anything that might make someone trip, such as rocks or tools. Regularly check to see if handrails are loose or broken. Make sure that both sides of any steps have handrails. Any raised decks and porches should have guardrails on the edges. Have any leaves, snow, or ice cleared regularly. Use sand or salt on walking paths during winter. Clean up any spills in  your garage right away. This includes oil or grease spills. What can I do in the bathroom? Use night lights. Install grab bars by the toilet and in the tub and shower. Do not use towel bars as grab bars. Use non-skid mats or decals in the tub or shower. If you need to sit down in the shower, use a plastic, non-slip stool. Keep the floor dry. Clean up any water that spills on the floor as soon as it happens. Remove soap buildup in the tub or shower regularly. Attach bath mats securely with double-sided non-slip rug tape. Do not have throw rugs and other things on the floor that can make you trip. What can I do in the bedroom? Use night lights. Make sure that you have a light by your bed that is easy to reach. Do not use any sheets or blankets that are too big for your bed. They should not hang down onto the floor. Have a firm chair that has side arms. You can use this for support while you get dressed. Do not have throw rugs and other things on the floor that can make you  trip. What can I do in the kitchen? Clean up any spills right away. Avoid walking on wet floors. Keep items that you use a lot in easy-to-reach places. If you need to reach something above you, use a strong step stool that has a grab bar. Keep electrical cords out of the way. Do not use floor polish or wax that makes floors slippery. If you must use wax, use non-skid floor wax. Do not have throw rugs and other things on the floor that can make you trip. What can I do with my stairs? Do not leave any items on the stairs. Make sure that there are handrails on both sides of the stairs and use them. Fix handrails that are broken or loose. Make sure that handrails are as long as the stairways. Check any carpeting to make sure that it is firmly attached to the stairs. Fix any carpet that is loose or worn. Avoid having throw rugs at the top or bottom of the stairs. If you do have throw rugs, attach them to the floor with carpet  tape. Make sure that you have a light switch at the top of the stairs and the bottom of the stairs. If you do not have them, ask someone to add them for you. What else can I do to help prevent falls? Wear shoes that: Do not have high heels. Have rubber bottoms. Are comfortable and fit you well. Are closed at the toe. Do not wear sandals. If you use a stepladder: Make sure that it is fully opened. Do not climb a closed stepladder. Make sure that both sides of the stepladder are locked into place. Ask someone to hold it for you, if possible. Clearly mark and make sure that you can see: Any grab bars or handrails. First and last steps. Where the edge of each step is. Use tools that help you move around (mobility aids) if they are needed. These include: Canes. Walkers. Scooters. Crutches. Turn on the lights when you go into a dark area. Replace any light bulbs as soon as they burn out. Set up your furniture so you have a clear path. Avoid moving your furniture around. If any of your floors are uneven, fix them. If there are any pets around you, be aware of where they are. Review your medicines with your doctor. Some medicines can make you feel dizzy. This can increase your chance of falling. Ask your doctor what other things that you can do to help prevent falls. This information is not intended to replace advice given to you by your health care provider. Make sure you discuss any questions you have with your health care provider. Document Released: 10/03/2009 Document Revised: 05/14/2016 Document Reviewed: 01/11/2015 Elsevier Interactive Patient Education  2017 [employer].

## 2023-06-08 NOTE — Progress Notes (Signed)
Subjective:   Taylor Berg is a 67 y.o. female who presents for Medicare Annual (Subsequent) preventive examination.  Visit Complete: Virtual  I connected with  HARLOW FREEMONT on 06/08/23 by a audio enabled telemedicine application and verified that I am speaking with the correct person using two identifiers.  Patient Location: Home  Provider Location: Home Office  I discussed the limitations of evaluation and management by telemedicine. The patient expressed understanding and agreed to proceed.  Patient Medicare AWV questionnaire was completed by the patient on 06/08/2023; I have confirmed that all information answered by patient is correct and no changes since this date.  Review of Systems     Cardiac Risk Factors include: advanced age (>8men, >26 women);dyslipidemia     Objective:    Today's Vitals   06/08/23 1120  Weight: 248 lb (112.5 kg)  Height: 5\' 3"  (1.6 m)   Body mass index is 43.93 kg/m.     06/08/2023   11:25 AM 06/04/2022   11:20 AM 03/30/2022   11:02 AM 12/29/2021    9:24 AM 02/16/2012    9:40 AM 02/15/2012    4:25 PM  Advanced Directives  Does Patient Have a Medical Advance Directive? No No No No  Patient does not have advance directive  Would patient like information on creating a medical advance directive? No - Patient declined No - Patient declined No - Patient declined Yes (MAU/Ambulatory/Procedural Areas - Information given)    Pre-existing out of facility DNR order (yellow form or pink MOST form)     No     Current Medications (verified) Outpatient Encounter Medications as of 06/08/2023  Medication Sig   levothyroxine (SYNTHROID) 137 MCG tablet Take 1 tablet (137 mcg total) by mouth daily before breakfast.   pravastatin (PRAVACHOL) 10 MG tablet Take 1 tablet (10 mg total) by mouth daily.   No facility-administered encounter medications on file as of 06/08/2023.    Allergies (verified) Penicillins, Sulfamethoxazole-trimethoprim, Iodinated  contrast media, Latex, Sulfa drugs cross reactors, and Wound dressing adhesive   History: Past Medical History:  Diagnosis Date   BMI 38.0-38.9,adult    history of anemia    Hypothyroidism    Past Surgical History:  Procedure Laterality Date   APPENDECTOMY     CERVICAL CONE BIOPSY     hx of   GAS INSERTION  02/16/2012   Procedure: INSERTION OF GAS;  Surgeon: Sherrie George, MD;  Location: [facility] OR;  Service: Ophthalmology;  Laterality: Right;  [id number]   PARS PLANA VITRECTOMY W/ SCLERAL BUCKLE  02/16/2012   Procedure: PARS PLANA VITRECTOMY WITH LASER FOR MACULAR HOLE;  Surgeon: Sherrie George, MD;  Location: [facility] OR;  Service: Ophthalmology;  Laterality: Right;  25 GAUGE PPV   TONSILLECTOMY     TUBAL LIGATION     VAGINAL HYSTERECTOMY  12/08/2021   Family History  Adopted: Yes  Problem Relation Age of Onset   Stroke Mother    Thyroid disease Mother    Hypertension Father    Jaundice Father    Heart attack Father    Thyroid disease Sister    Hypertension Brother    Thyroid disease Brother    Hypertension Brother    Thyroid disease Brother    Thyroid disease Brother    Colon cancer Neg Hx    Breast cancer Neg Hx    Ovarian cancer Neg Hx    Endometrial cancer Neg Hx    Pancreatic cancer Neg Hx    Social History  Socioeconomic History   Marital status: Widowed    Spouse name: Not on file   Number of children: 3   Years of education: Not on file   Highest education level: Not on file  Occupational History   Occupation: retired  Tobacco Use   Smoking status: Former    Types: Cigarettes    Quit date: 2007    Years since quitting: 17.4   Smokeless tobacco: Not on file   Tobacco comments:    stopped smoking 2007  Vaping Use   Vaping Use: Never used  Substance and Sexual Activity   Alcohol use: Yes    Alcohol/week: 2.0 standard drinks of alcohol    Types: 2 Shots of liquor per week   Drug use: No   Sexual activity: Not Currently  Other Topics Concern   Not on file   Social History Narrative   Patient reports that husband passed away on 01-13-22, has 3 sons (healthy)    One son and his wife moved in with her recently   Social Determinants of Health   Financial Resource Strain: Low Risk  (06/08/2023)   Overall Financial Resource Strain (CARDIA)    Difficulty of Paying Living Expenses: Not hard at all  Food Insecurity: No Food Insecurity (06/08/2023)   Hunger Vital Sign    Worried About Running Out of Food in the Last Year: Never true    Ran Out of Food in the Last Year: Never true  Transportation Needs: No Transportation Needs (06/08/2023)   PRAPARE - Administrator, Civil Service (Medical): No    Lack of Transportation (Non-Medical): No  Physical Activity: Insufficiently Active (06/08/2023)   Exercise Vital Sign    Days of Exercise per Week: 2 days    Minutes of Exercise per Session: 30 min  Stress: No Stress Concern Present (06/08/2023)   [employer] of Occupational Health - Occupational Stress Questionnaire    Feeling of Stress : Not at all  Social Connections: Moderately Isolated (06/08/2023)   Social Connection and Isolation Panel [NHANES]    Frequency of Communication with Friends and Family: More than three times a week    Frequency of Social Gatherings with Friends and Family: More than three times a week    Attends Religious Services: 1 to 4 times per year    Active Member of [employer] or Organizations: No    Attends Banker Meetings: Never    Marital Status: Widowed    Tobacco Counseling Counseling given: Not Answered Tobacco comments: stopped smoking 2007   Clinical Intake:  Pre-visit preparation completed: Yes  Pain : No/denies pain     Nutritional Risks: None Diabetes: No  How often do you need to have someone help you when you read instructions, pamphlets, or other written materials from your doctor or pharmacy?: 1 - Never  Interpreter Needed?: No  Information entered by :: Renie Ora,  LPN   Activities of Daily Living    06/08/2023   11:25 AM  In your present state of health, do you have any difficulty performing the following activities:  Hearing? 0  Vision? 0  Difficulty concentrating or making decisions? 0  Walking or climbing stairs? 0  Dressing or bathing? 0  Doing errands, shopping? 0  Preparing Food and eating ? N  Using the Toilet? N  In the past six months, have you accidently leaked urine? N  Do you have problems with loss of bowel control? N  Managing your Medications? N  Managing your Finances? N  Housekeeping or managing your Housekeeping? N    Patient Care Team: Sonny Masters, FNP as PCP - General (Family Medicine) Silas Flood, MD as Referring Physician (Obstetrics and Gynecology) Carver Fila, MD as Consulting Physician (Gynecologic Oncology)  Indicate any recent Medical Services you may have received from other than [facility] providers in the past year (date may be approximate).     Assessment:   This is a routine wellness examination for Lilyanah.  Hearing/Vision screen Vision Screening - Comments:: Wears rx glasses - up to date with routine eye exams with  Dr.Chisman   Dietary issues and exercise activities discussed:     Goals Addressed             This Visit's Progress    DIET - INCREASE WATER INTAKE         Depression Screen    06/08/2023   11:23 AM 03/09/2023    2:08 PM 09/08/2022    2:09 PM 06/17/2022   11:02 AM 06/04/2022   11:28 AM 03/18/2022    3:39 PM 02/24/2022   12:04 PM  PHQ 2/9 Scores  PHQ - 2 Score 0 0 1 0 2 3 2   PHQ- 9 Score  0 2 0 6 7 5     Fall Risk    06/08/2023   11:21 AM 03/09/2023    2:08 PM 09/08/2022    2:10 PM 06/17/2022   11:03 AM 06/04/2022   11:12 AM  Fall Risk   Falls in the past year? 1 0 0 0 0  Number falls in past yr: 1    0  Injury with Fall? 1    0  Risk for fall due to : History of fall(s);Impaired balance/gait;Orthopedic patient    No Fall Risks  Follow up Education  provided;Falls prevention discussed    Falls prevention discussed    MEDICARE RISK AT HOME:  Medicare Risk at Home - 06/08/23 1121     Any stairs in or around the home? Yes    If so, are there any without handrails? No    Home free of loose throw rugs in walkways, pet beds, electrical cords, etc? Yes    Adequate lighting in your home to reduce risk of falls? Yes    Life alert? Yes    Use of a cane, walker or w/c? No    Grab bars in the bathroom? No    Shower chair or bench in shower? No    Elevated toilet seat or a handicapped toilet? No             TIMED UP AND GO:  Was the test performed?  No    Cognitive Function:        06/08/2023   11:26 AM 06/04/2022   11:29 AM  6CIT Screen  What Year? 0 points 0 points  What month? 0 points 0 points  What time? 0 points 0 points  Count back from 20 0 points 0 points  Months in reverse 0 points 0 points  Repeat phrase 0 points 2 points  Total Score 0 points 2 points    Immunizations  There is no immunization history on file for this patient.  TDAP status: Due, Education has been provided regarding the importance of this vaccine. Advised may receive this vaccine at local pharmacy or Health Dept. Aware to provide a copy of the vaccination record if obtained from local pharmacy or Health Dept. Verbalized acceptance and understanding.  Flu  Vaccine status: Declined, Education has been provided regarding the importance of this vaccine but patient still declined. Advised may receive this vaccine at local pharmacy or Health Dept. Aware to provide a copy of the vaccination record if obtained from local pharmacy or Health Dept. Verbalized acceptance and understanding.  Pneumococcal vaccine status: Declined,  Education has been provided regarding the importance of this vaccine but patient still declined. Advised may receive this vaccine at local pharmacy or Health Dept. Aware to provide a copy of the vaccination record if obtained from  local pharmacy or Health Dept. Verbalized acceptance and understanding.   Covid-19 vaccine status: Declined, Education has been provided regarding the importance of this vaccine but patient still declined. Advised may receive this vaccine at local pharmacy or Health Dept.or vaccine clinic. Aware to provide a copy of the vaccination record if obtained from local pharmacy or Health Dept. Verbalized acceptance and understanding.  Qualifies for Shingles Vaccine? Yes   Zostavax completed No   Shingrix Completed?: No.    Education has been provided regarding the importance of this vaccine. Patient has been advised to call insurance company to determine out of pocket expense if they have not yet received this vaccine. Advised may also receive vaccine at local pharmacy or Health Dept. Verbalized acceptance and understanding.  Screening Tests Health Maintenance  Topic Date Due   COVID-19 Vaccine (1) Never done   DTaP/Tdap/Td (1 - Tdap) Never done   Colonoscopy  Never done   Medicare Annual Wellness (AWV)  06/05/2023   Zoster Vaccines- Shingrix (1 of 2) 06/09/2023 (Originally 01/10/1975)   Pneumonia Vaccine 59+ Years old (1 of 1 - PCV) 03/08/2024 (Originally 01/10/2021)   Hepatitis C Screening  03/08/2024 (Originally 01/10/1974)   INFLUENZA VACCINE  07/22/2023   MAMMOGRAM  08/15/2023   DEXA SCAN  Completed   HPV VACCINES  Aged Out    Health Maintenance  Health Maintenance Due  Topic Date Due   COVID-19 Vaccine (1) Never done   DTaP/Tdap/Td (1 - Tdap) Never done   Colonoscopy  Never done   Medicare Annual Wellness (AWV)  06/05/2023    Colorectal cancer screening: Referral to GI placed 06/08/2023. Pt aware the office will call re: appt.  Mammogram status: Completed 08/14/2022. Repeat every year  Bone Density status: Completed 06/19/2022. Results reflect: Bone density results: OSTEOPENIA. Repeat every 5 years.  Lung Cancer Screening: (Low Dose CT Chest recommended if Age 64-80 years, 20  pack-year currently smoking OR have quit w/in 15years.) does not qualify.   Lung Cancer Screening Referral: n/a  Additional Screening:  Hepatitis C Screening: does not qualify;   Vision Screening: Recommended annual ophthalmology exams for early detection of glaucoma and other disorders of the eye. Is the patient up to date with their annual eye exam?  Yes  Who is the provider or what is the name of the office in which the patient attends annual eye exams? Dr.Christman  If pt is not established with a provider, would they like to be referred to a provider to establish care? No .   Dental Screening: Recommended annual dental exams for proper oral hygiene  Diabetic Foot Exam: Diabetic Foot Exam: Overdue, Pt has been advised about the importance in completing this exam. Pt is scheduled for diabetic foot exam on next office visit .  Community Resource Referral / Chronic Care Management: CRR required this visit?  No   CCM required this visit?  No     Plan:     I have  personally reviewed and noted the following in the patient's chart:   Medical and social history Use of alcohol, tobacco or illicit drugs  Current medications and supplements including opioid prescriptions. Patient is not currently taking opioid prescriptions. Functional ability and status Nutritional status Physical activity Advanced directives List of other physicians Hospitalizations, surgeries, and ER visits in previous 12 months Vitals Screenings to include cognitive, depression, and falls Referrals and appointments  In addition, I have reviewed and discussed with patient certain preventive protocols, quality metrics, and best practice recommendations. A written personalized care plan for preventive services as well as general preventive health recommendations were provided to patient.     Lorrene Reid, LPN   [id number]   After Visit Summary: (MyChart) Due to this being a telephonic visit, the after visit  summary with patients personalized plan was offered to patient via MyChart   Nurse Notes: No vaccine on file

## 2023-06-16 ENCOUNTER — Ambulatory Visit (INDEPENDENT_AMBULATORY_CARE_PROVIDER_SITE_OTHER): Payer: Medicare Other | Admitting: Family Medicine

## 2023-06-16 ENCOUNTER — Encounter: Payer: Self-pay | Admitting: Family Medicine

## 2023-06-16 VITALS — BP 141/77 | HR 68 | Temp 97.5°F | Ht 63.0 in | Wt 248.6 lb

## 2023-06-16 DIAGNOSIS — E559 Vitamin D deficiency, unspecified: Secondary | ICD-10-CM | POA: Diagnosis not present

## 2023-06-16 DIAGNOSIS — Z1211 Encounter for screening for malignant neoplasm of colon: Secondary | ICD-10-CM | POA: Diagnosis not present

## 2023-06-16 DIAGNOSIS — E039 Hypothyroidism, unspecified: Secondary | ICD-10-CM | POA: Diagnosis not present

## 2023-06-16 DIAGNOSIS — Z1212 Encounter for screening for malignant neoplasm of rectum: Secondary | ICD-10-CM

## 2023-06-16 DIAGNOSIS — Z23 Encounter for immunization: Secondary | ICD-10-CM

## 2023-06-16 NOTE — Progress Notes (Signed)
Subjective:  Patient ID: Taylor Berg, female    DOB: 1956/03/10, 67 y.o.   MRN: [id number]  Patient Care Team: Sonny Masters, FNP as PCP - General (Family Medicine) Silas Flood, MD as Referring Physician (Obstetrics and Gynecology) Carver Fila, MD as Consulting Physician (Gynecologic Oncology)   Chief Complaint:  Medical Management of Chronic Issues (3 month follow up )   HPI: Taylor Berg is a 67 y.o. female presenting on 06/16/2023 for Medical Management of Chronic Issues (3 month follow up )    1. Acquired hypothyroidism Compliant with medications - Yes Current medications - Synthroid 137 mcg Adverse side effects - No Weight - stable  Bowel habit changes - No Heat or cold intolerance - No Mood changes - No Changes in sleep habits - No Fatigue - No Skin, hair, or nail changes - No Tremor - No Palpitations - No Edema - No Shortness of breath - No  2. Vitamin D deficiency Pt is not taking oral repletion therapy. Denies bone pain and tenderness, muscle weakness, fracture, and difficulty walking. Lab Results  Component Value Date   VD25OH 19.9 (L) 03/09/2023   Lab Results  Component Value Date   CALCIUM 9.4 03/09/2023      3. Morbid obesity (HCC) Hs not been dieting or exercising.        Relevant past medical, surgical, family, and social history reviewed and updated as indicated.  Allergies and medications reviewed and updated. Data reviewed: Chart in Epic.   Past Medical History:  Diagnosis Date   BMI 38.0-38.9,adult    history of anemia    Hypothyroidism     Past Surgical History:  Procedure Laterality Date   APPENDECTOMY     CERVICAL CONE BIOPSY     hx of   GAS INSERTION  02/16/2012   Procedure: INSERTION OF GAS;  Surgeon: Sherrie George, MD;  Location: [facility] OR;  Service: Ophthalmology;  Laterality: Right;  C3F8   PARS PLANA VITRECTOMY W/ SCLERAL BUCKLE  02/16/2012   Procedure: PARS PLANA VITRECTOMY WITH LASER FOR MACULAR  HOLE;  Surgeon: Sherrie George, MD;  Location: [facility] OR;  Service: Ophthalmology;  Laterality: Right;  25 GAUGE PPV   TONSILLECTOMY     TUBAL LIGATION     VAGINAL HYSTERECTOMY  12/08/2021    Social History   Socioeconomic History   Marital status: Widowed    Spouse name: Not on file   Number of children: 3   Years of education: Not on file   Highest education level: Not on file  Occupational History   Occupation: retired  Tobacco Use   Smoking status: Former    Types: Cigarettes    Quit date: 2007    Years since quitting: 17.4   Smokeless tobacco: Not on file   Tobacco comments:    stopped smoking 2007  Vaping Use   Vaping Use: Never used  Substance and Sexual Activity   Alcohol use: Yes    Alcohol/week: 2.0 standard drinks of alcohol    Types: 2 Shots of liquor per week   Drug use: No   Sexual activity: Not Currently  Other Topics Concern   Not on file  Social History Narrative   Patient reports that husband passed away on 01-15-22, has 3 sons (healthy)    One son and his wife moved in with her recently   Social Determinants of Health   Financial Resource Strain: Low Risk  (06/08/2023)   Overall Financial  Resource Strain (CARDIA)    Difficulty of Paying Living Expenses: Not hard at all  Food Insecurity: No Food Insecurity (06/08/2023)   Hunger Vital Sign    Worried About Running Out of Food in the Last Year: Never true    Ran Out of Food in the Last Year: Never true  Transportation Needs: No Transportation Needs (06/08/2023)   PRAPARE - Administrator, Civil Service (Medical): No    Lack of Transportation (Non-Medical): No  Physical Activity: Insufficiently Active (06/08/2023)   Exercise Vital Sign    Days of Exercise per Week: 2 days    Minutes of Exercise per Session: 30 min  Stress: No Stress Concern Present (06/08/2023)   [employer] of Occupational Health - Occupational Stress Questionnaire    Feeling of Stress : Not at all  Social  Connections: Moderately Isolated (06/08/2023)   Social Connection and Isolation Panel [NHANES]    Frequency of Communication with Friends and Family: More than three times a week    Frequency of Social Gatherings with Friends and Family: More than three times a week    Attends Religious Services: 1 to 4 times per year    Active Member of [employer] or Organizations: No    Attends Banker Meetings: Never    Marital Status: Widowed  Intimate Partner Violence: Not At Risk (06/08/2023)   Humiliation, Afraid, Rape, and Kick questionnaire    Fear of Current or Ex-Partner: No    Emotionally Abused: No    Physically Abused: No    Sexually Abused: No    Outpatient Encounter Medications as of 06/16/2023  Medication Sig   levothyroxine (SYNTHROID) 137 MCG tablet Take 1 tablet (137 mcg total) by mouth daily before breakfast.   pravastatin (PRAVACHOL) 10 MG tablet Take 1 tablet (10 mg total) by mouth daily.   No facility-administered encounter medications on file as of 06/16/2023.    Allergies  Allergen Reactions   Penicillins Anaphylaxis   Sulfamethoxazole-Trimethoprim Hives and Rash   Iodinated Contrast Media Rash    Under breast and groin rash that developed after IV and PO contrast for CT scan in 12/2021   Latex Rash   Sulfa Drugs Cross Reactors Hives and Rash   Wound Dressing Adhesive Rash    bandaids cause skin breakout    Review of Systems  Constitutional:  Negative for activity change, appetite change, chills, diaphoresis, fatigue, fever and unexpected weight change.  HENT: Negative.    Eyes: Negative.  Negative for photophobia and visual disturbance.  Respiratory:  Negative for cough, chest tightness and shortness of breath.   Cardiovascular:  Negative for chest pain, palpitations and leg swelling.  Gastrointestinal:  Negative for abdominal pain, blood in stool, constipation, diarrhea, nausea and vomiting.  Endocrine: Negative.   Genitourinary:  Negative for decreased urine  volume, difficulty urinating, dysuria, frequency and urgency.  Musculoskeletal:  Negative for arthralgias and myalgias.  Skin: Negative.   Allergic/Immunologic: Negative.   Neurological:  Negative for dizziness, tremors, seizures, syncope, facial asymmetry, speech difficulty, weakness, light-headedness, numbness and headaches.  Hematological: Negative.   Psychiatric/Behavioral:  Negative for agitation, behavioral problems, confusion, decreased concentration, dysphoric mood, hallucinations, self-injury, sleep disturbance and suicidal ideas. The patient is nervous/anxious. The patient is not hyperactive.   All other systems reviewed and are negative.       Objective:  BP (!) 141/77   Pulse 68   Temp (!) 97.5 F (36.4 C) (Temporal)   Ht 5\' 3"  (1.6 m)  Wt 248 lb 9.6 oz (112.8 kg)   SpO2 98%   BMI 44.04 kg/m    Wt Readings from Last 3 Encounters:  06/16/23 248 lb 9.6 oz (112.8 kg)  06/08/23 248 lb (112.5 kg)  03/09/23 254 lb 12.8 oz (115.6 kg)    Physical Exam Vitals and nursing note reviewed.  Constitutional:      General: She is not in acute distress.    Appearance: Normal appearance. She is well-developed and well-groomed. She is morbidly obese. She is not ill-appearing, toxic-appearing or diaphoretic.  HENT:     Head: Normocephalic and atraumatic.     Jaw: There is normal jaw occlusion.     Right Ear: Hearing normal.     Left Ear: Hearing normal.     Nose: Nose normal.     Mouth/Throat:     Lips: Pink.     Mouth: Mucous membranes are moist.     Pharynx: Uvula midline.  Eyes:     General: Lids are normal.     Conjunctiva/sclera: Conjunctivae normal.     Pupils: Pupils are equal, round, and reactive to light.  Neck:     Trachea: Trachea and phonation normal.  Cardiovascular:     Rate and Rhythm: Normal rate and regular rhythm.     Chest Wall: PMI is not displaced.     Pulses: Normal pulses.     Heart sounds: Normal heart sounds. No murmur heard.    No friction  rub. No gallop.  Pulmonary:     Effort: Pulmonary effort is normal. No respiratory distress.     Breath sounds: Normal breath sounds. No wheezing.  Abdominal:     General: Bowel sounds are normal. There is no abdominal bruit.     Palpations: Abdomen is soft. There is no hepatomegaly or splenomegaly.  Musculoskeletal:        General: Normal range of motion.     Cervical back: Normal range of motion.     Right lower leg: No edema.     Left lower leg: No edema.  Skin:    General: Skin is warm and dry.     Capillary Refill: Capillary refill takes less than 2 seconds.     Coloration: Skin is not cyanotic, jaundiced or pale.     Findings: No rash.  Neurological:     General: No focal deficit present.     Mental Status: She is alert and oriented to person, place, and time.     Sensory: Sensation is intact.     Motor: Motor function is intact.     Coordination: Coordination is intact.     Gait: Gait is intact.     Deep Tendon Reflexes: Reflexes are normal and symmetric.  Psychiatric:        Attention and Perception: Attention and perception normal.        Mood and Affect: Mood and affect normal.        Speech: Speech normal.        Behavior: Behavior normal. Behavior is cooperative.        Thought Content: Thought content normal.        Cognition and Memory: Cognition and memory normal.        Judgment: Judgment normal.     Results for orders placed or performed in visit on 03/09/23  CBC with Differential/Platelet  Result Value Ref Range   WBC 5.3 3.4 - 10.8 x10E3/uL   RBC 4.51 3.77 - 5.28 x10E6/uL   Hemoglobin 13.7  11.1 - 15.9 g/dL   Hematocrit 59.5 63.8 - 46.6 %   MCV 91 79 - 97 fL   MCH 30.4 26.6 - 33.0 pg   MCHC 33.3 31.5 - 35.7 g/dL   RDW 75.6 43.3 - 29.5 %   Platelets 270 150 - 450 x10E3/uL   Neutrophils 55 Not Estab. %   Lymphs 33 Not Estab. %   Monocytes 9 Not Estab. %   Eos 2 Not Estab. %   Basos 1 Not Estab. %   Neutrophils Absolute 2.9 1.4 - 7.0 x10E3/uL    Lymphocytes Absolute 1.7 0.7 - 3.1 x10E3/uL   Monocytes Absolute 0.5 0.1 - 0.9 x10E3/uL   EOS (ABSOLUTE) 0.1 0.0 - 0.4 x10E3/uL   Basophils Absolute 0.1 0.0 - 0.2 x10E3/uL   Immature Granulocytes 0 Not Estab. %   Immature Grans (Abs) 0.0 0.0 - 0.1 x10E3/uL  CMP14+EGFR  Result Value Ref Range   Glucose 87 70 - 99 mg/dL   BUN 10 8 - 27 mg/dL   Creatinine, Ser 1.88 0.57 - 1.00 mg/dL   eGFR 83 >41 YS/AYT/0.16   BUN/Creatinine Ratio 13 12 - 28   Sodium 138 134 - 144 mmol/L   Potassium 4.5 3.5 - 5.2 mmol/L   Chloride 100 96 - 106 mmol/L   CO2 21 20 - 29 mmol/L   Calcium 9.4 8.7 - 10.3 mg/dL   Total Protein 7.1 6.0 - 8.5 g/dL   Albumin 4.2 3.9 - 4.9 g/dL   Globulin, Total 2.9 1.5 - 4.5 g/dL   Albumin/Globulin Ratio 1.4 1.2 - 2.2   Bilirubin Total 0.5 0.0 - 1.2 mg/dL   Alkaline Phosphatase 83 44 - 121 IU/L   AST 21 0 - 40 IU/L   ALT 15 0 - 32 IU/L  Thyroid Panel With TSH  Result Value Ref Range   TSH 9.420 (H) 0.450 - 4.500 uIU/mL   T4, Total 9.0 4.5 - 12.0 ug/dL   T3 Uptake Ratio >01 (H) 24 - 39 %   Free Thyroxine Index >5.0 (H) 1.2 - 4.9  Lipid panel  Result Value Ref Range   Cholesterol, Total 239 (H) 100 - 199 mg/dL   Triglycerides 75 0 - 149 mg/dL   HDL 64 >09 mg/dL   VLDL Cholesterol Cal 13 5 - 40 mg/dL   LDL Chol Calc (NIH) 323 (H) 0 - 99 mg/dL   Chol/HDL Ratio 3.7 0.0 - 4.4 ratio  VITAMIN D 25 Hydroxy (Vit-D Deficiency, Fractures)  Result Value Ref Range   Vit D, 25-Hydroxy 19.9 (L) 30.0 - 100.0 ng/mL  Bayer DCA Hb A1c Waived  Result Value Ref Range   HB A1C (BAYER DCA - WAIVED) 5.2 4.8 - 5.6 %       Pertinent labs & imaging results that were available during my care of the patient were reviewed by me and considered in my medical decision making.  Assessment & Plan:  Vernestine was seen today for medical management of chronic issues.  Diagnoses and all orders for this visit:  Acquired hypothyroidism Thyroid disease has been fairly controlled. Labs are pending.  Adjustments to regimen will be made if warranted. Make sure to take medications on an empty stomach with a full glass of water. Make sure to avoid vitamins or supplements for at least 4 hours before and 4 hours after taking medications. Repeat labs in 3 months if adjustments are made and in 6 months if stable.   -     Thyroid Panel With TSH  Vitamin D deficiency Will check levels today and start repletion therapy if warranted.  -     CMP14+EGFR -     VITAMIN D 25 Hydroxy (Vit-D Deficiency, Fractures)  Morbid obesity (HCC) Diet and exercise encouraged.  -     CMP14+EGFR -     CBC with Differential/Platelet -     Thyroid Panel With TSH -     VITAMIN D 25 Hydroxy (Vit-D Deficiency, Fractures) -     Lipid panel  Screening for colorectal cancer -     Ambulatory referral to Gastroenterology  Need for vaccination Tdap updated today.     Continue all other maintenance medications.  Follow up plan: Return in about 6 months (around 12/16/2023) for thyroid.   Continue healthy lifestyle choices, including diet (rich in fruits, vegetables, and lean proteins, and low in salt and simple carbohydrates) and exercise (at least 30 minutes of moderate physical activity daily).  Educational handout given for DASH diet, HTN  The above assessment and management plan was discussed with the patient. The patient verbalized understanding of and has agreed to the management plan. Patient is aware to call the clinic if they develop any new symptoms or if symptoms persist or worsen. Patient is aware when to return to the clinic for a follow-up visit. Patient educated on when it is appropriate to go to the emergency department.   Kari Baars, FNP-C [facility] [phone]

## 2023-06-16 NOTE — Patient Instructions (Signed)

## 2023-06-16 NOTE — Addendum Note (Signed)
Addended by: Angela Adam on: 06/16/2023 02:12 PM   Modules accepted: Orders

## 2023-06-17 ENCOUNTER — Encounter: Payer: Self-pay | Admitting: *Deleted

## 2023-06-17 LAB — CBC WITH DIFFERENTIAL/PLATELET
Basophils Absolute: 0 10*3/uL (ref 0.0–0.2)
Basos: 1 %
EOS (ABSOLUTE): 0.1 10*3/uL (ref 0.0–0.4)
Eos: 3 %
Hematocrit: 42.2 % (ref 34.0–46.6)
Hemoglobin: 13.4 g/dL (ref 11.1–15.9)
Immature Grans (Abs): 0 10*3/uL (ref 0.0–0.1)
Immature Granulocytes: 0 %
Lymphocytes Absolute: 1.7 10*3/uL (ref 0.7–3.1)
Lymphs: 37 %
MCH: 29.3 pg (ref 26.6–33.0)
MCHC: 31.8 g/dL (ref 31.5–35.7)
MCV: 92 fL (ref 79–97)
Monocytes Absolute: 0.4 10*3/uL (ref 0.1–0.9)
Monocytes: 9 %
Neutrophils Absolute: 2.3 10*3/uL (ref 1.4–7.0)
Neutrophils: 50 %
Platelets: 276 10*3/uL (ref 150–450)
RBC: 4.57 x10E6/uL (ref 3.77–5.28)
RDW: 13.5 % (ref 11.7–15.4)
WBC: 4.5 10*3/uL (ref 3.4–10.8)

## 2023-06-17 LAB — CMP14+EGFR
ALT: 18 IU/L (ref 0–32)
AST: 22 IU/L (ref 0–40)
Albumin: 4.4 g/dL (ref 3.9–4.9)
Alkaline Phosphatase: 104 IU/L (ref 44–121)
BUN/Creatinine Ratio: 19 (ref 12–28)
BUN: 13 mg/dL (ref 8–27)
Bilirubin Total: 0.4 mg/dL (ref 0.0–1.2)
CO2: 23 mmol/L (ref 20–29)
Calcium: 9.3 mg/dL (ref 8.7–10.3)
Chloride: 101 mmol/L (ref 96–106)
Creatinine, Ser: 0.69 mg/dL (ref 0.57–1.00)
Globulin, Total: 2.6 g/dL (ref 1.5–4.5)
Glucose: 82 mg/dL (ref 70–99)
Potassium: 4.7 mmol/L (ref 3.5–5.2)
Sodium: 139 mmol/L (ref 134–144)
Total Protein: 7 g/dL (ref 6.0–8.5)
eGFR: 95 mL/min/{1.73_m2} (ref >59)

## 2023-06-17 LAB — VITAMIN D 25 HYDROXY (VIT D DEFICIENCY, FRACTURES): Vit D, 25-Hydroxy: 28.2 ng/mL — ABNORMAL LOW (ref 30.0–100.0)

## 2023-06-17 LAB — THYROID PANEL WITH TSH
Free Thyroxine Index: >6.2 — ABNORMAL HIGH (ref 1.2–4.9)
T3 Uptake Ratio: >56 % — ABNORMAL HIGH (ref 24–39)
T4, Total: 11.1 ug/dL (ref 4.5–12.0)
TSH: 2.47 u[IU]/mL (ref 0.450–4.500)

## 2023-06-17 LAB — LIPID PANEL
Chol/HDL Ratio: 3.2 ratio (ref 0.0–4.4)
Cholesterol, Total: 181 mg/dL (ref 100–199)
HDL: 57 mg/dL (ref >39)
LDL Chol Calc (NIH): 110 mg/dL — ABNORMAL HIGH (ref 0–99)
Triglycerides: 77 mg/dL (ref 0–149)
VLDL Cholesterol Cal: 14 mg/dL (ref 5–40)

## 2023-06-18 ENCOUNTER — Other Ambulatory Visit: Payer: Self-pay

## 2023-06-18 ENCOUNTER — Encounter: Payer: Self-pay | Admitting: Gynecologic Oncology

## 2023-06-18 ENCOUNTER — Inpatient Hospital Stay: Payer: Medicare Other | Attending: Gynecologic Oncology | Admitting: Gynecologic Oncology

## 2023-06-18 VITALS — BP 106/59 | HR 73 | Temp 98.0°F | Ht 64.96 in | Wt 248.6 lb

## 2023-06-18 DIAGNOSIS — Z8541 Personal history of malignant neoplasm of cervix uteri: Secondary | ICD-10-CM

## 2023-06-18 DIAGNOSIS — C539 Malignant neoplasm of cervix uteri, unspecified: Secondary | ICD-10-CM

## 2023-06-18 DIAGNOSIS — Z90710 Acquired absence of both cervix and uterus: Secondary | ICD-10-CM | POA: Diagnosis not present

## 2023-06-18 NOTE — Patient Instructions (Signed)
It was good to see you today.  I do not see or feel any evidence of cancer recurrence on your exam.  I will see you for follow-up in 6 months.  As always, if you develop any new and concerning symptoms before your next visit, please call to see me sooner.   

## 2023-06-18 NOTE — Progress Notes (Signed)
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  Second of these is noted to be prominent.  No findings of definitive metastatic disease.   12/29/2021 Initial Diagnosis   Malignant neoplasm of cervix (HCC)   01/08/2022 Imaging   PET: IMPRESSION: 1. No findings to suggest metastatic disease involving the chest, abdomen or pelvis. 2. Diffuse hypermetabolism in the thyroid gland is likely due to thyroiditis. 3. No significant bony findings. 4. Stable cholelithiasis.    07/21/21: pap - HSIL 09/19/21: Cervical/endocervical biopsy shows H SIL (CIN-3) and superficial fragments of cervical tissue.  Comment is that due to tissue fragmentation and superficial nature of the tissue, invasive process cannot be excluded. 12/08/2021: Vaginal hysterectomy and cystoscopy with Dr. Ralph Dowdy.  Findings at the time of surgery were normal uterus and cervix. Pathology: Invasive squamous cell carcinoma, grade 2.  Tumor size 7 mm.  Depth of stromal invasion 2 mm.  Margins uninvolved by carcinoma.  Margins uninvolved by high-grade dysplasia.  LVSI not identified.  High-grade squamous intraepithelial lesion noted to involve the endocervical glands.  Endometrium with benign endometrial type polyp.  Interval History: Doing well.  Denies vaginal bleeding or discharge.  Denies any abdominal or pelvic pain.  Reports baseline bowel bladder function.  Past  Medical/Surgical History: Past Medical History:  Diagnosis Date   BMI 38.0-38.9,adult    history of anemia    Hypothyroidism     Past Surgical History:  Procedure Laterality Date   APPENDECTOMY     CERVICAL CONE BIOPSY     hx of   GAS INSERTION  02/16/2012   Procedure: INSERTION OF GAS;  Surgeon: Sherrie George, MD;  Location: [facility] OR;  Service: Ophthalmology;  Laterality: Right;  C3F8   PARS PLANA VITRECTOMY W/ SCLERAL BUCKLE  02/16/2012   Procedure: PARS PLANA VITRECTOMY WITH LASER FOR MACULAR HOLE;  Surgeon: Sherrie George, MD;  Location: [facility] OR;  Service: Ophthalmology;  Laterality: Right;  25 GAUGE PPV   TONSILLECTOMY     TUBAL LIGATION     VAGINAL HYSTERECTOMY  12/08/2021    Family History  Adopted: Yes  Problem Relation Age of Onset   Stroke Mother    Thyroid disease Mother    Hypertension Father    Jaundice Father    Heart attack Father    Thyroid disease Sister    Hypertension Brother    Thyroid disease Brother    Hypertension Brother    Thyroid disease Brother    Thyroid disease Brother    Colon cancer Neg Hx    Breast cancer Neg Hx    Ovarian cancer Neg Hx    Endometrial cancer Neg Hx    Pancreatic cancer Neg Hx     Social History   Socioeconomic History   Marital status: Widowed    Spouse name: Not on file   Number of children: 3   Years of education: Not on file  Highest education level: Not on file  Occupational History   Occupation: retired  Tobacco Use   Smoking status: Former    Types: Cigarettes    Quit date: 2007    Years since quitting: 17.5   Smokeless tobacco: Not on file   Tobacco comments:    stopped smoking 2007  Vaping Use   Vaping Use: Never used  Substance and Sexual Activity   Alcohol use: Yes    Alcohol/week: 2.0 standard drinks of alcohol    Types: 2 Shots of liquor per week   Drug use: No   Sexual activity: Not Currently  Other Topics Concern   Not on file  Social History Narrative   Patient reports that husband  passed away on 01-12-22, has 3 sons (healthy)    One son and his wife moved in with her recently   Social Determinants of Health   Financial Resource Strain: Low Risk  (06/08/2023)   Overall Financial Resource Strain (CARDIA)    Difficulty of Paying Living Expenses: Not hard at all  Food Insecurity: No Food Insecurity (06/08/2023)   Hunger Vital Sign    Worried About Running Out of Food in the Last Year: Never true    Ran Out of Food in the Last Year: Never true  Transportation Needs: No Transportation Needs (06/08/2023)   PRAPARE - Administrator, Civil Service (Medical): No    Lack of Transportation (Non-Medical): No  Physical Activity: Insufficiently Active (06/08/2023)   Exercise Vital Sign    Days of Exercise per Week: 2 days    Minutes of Exercise per Session: 30 min  Stress: No Stress Concern Present (06/08/2023)   [employer] of Occupational Health - Occupational Stress Questionnaire    Feeling of Stress : Not at all  Social Connections: Moderately Isolated (06/08/2023)   Social Connection and Isolation Panel [NHANES]    Frequency of Communication with Friends and Family: More than three times a week    Frequency of Social Gatherings with Friends and Family: More than three times a week    Attends Religious Services: 1 to 4 times per year    Active Member of [employer] or Organizations: No    Attends Banker Meetings: Never    Marital Status: Widowed    Current Medications:  Current Outpatient Medications:    levothyroxine (SYNTHROID) 137 MCG tablet, Take 1 tablet (137 mcg total) by mouth daily before breakfast., Disp: 90 tablet, Rfl: 1   pravastatin (PRAVACHOL) 10 MG tablet, Take 1 tablet (10 mg total) by mouth daily., Disp: 90 tablet, Rfl: 3  Review of Systems: Denies appetite changes, fevers, chills, fatigue, unexplained weight changes. Denies hearing loss, neck lumps or masses, mouth sores, ringing in ears or voice changes. Denies cough or  wheezing.  Denies shortness of breath. Denies chest pain or palpitations. Denies leg swelling. Denies abdominal distention, pain, blood in stools, constipation, diarrhea, nausea, vomiting, or early satiety. Denies pain with intercourse, dysuria, frequency, hematuria or incontinence. Denies hot flashes, pelvic pain, vaginal bleeding or vaginal discharge.   Denies joint pain, back pain or muscle pain/cramps. Denies itching, rash, or wounds. Denies dizziness, headaches, numbness or seizures. Denies swollen lymph nodes or glands, denies easy bruising or bleeding. Denies anxiety, depression, confusion, or decreased concentration.  Physical Exam: BP (!) 106/59 (BP Location: Left Arm, Patient Position: Sitting)   Pulse 73   Temp 98 F (36.7 C)   Ht 5' 4.96" (1.65 m)   Wt 248 lb 9.6  oz (112.8 kg)   SpO2 97%   BMI 41.42 kg/m  General: Alert, oriented, no acute distress. HEENT: Traumatic, normocephalic, sclera anicteric. Chest: Clear to auscultation bilaterally.  No wheezes or rhonchi. Cardiovascular: Regular rate and rhythm, no murmurs. Abdomen: Obese, soft, nontender.  Normoactive bowel sounds.  No masses or hepatosplenomegaly appreciated.  Extremities: Grossly normal range of motion.  Warm, well perfused.  No edema bilaterally. Skin: No rashes or lesions noted. Lymphatics: No cervical, supraclavicular, or inguinal adenopathy. GU: Normal appearing external genitalia without erythema, excoriation, or lesions.  Speculum exam reveals mildly atrophic vaginal mucosa, cuff intact without lesions.  Bimanual exam reveals no masses or nodularity.  Rectovaginal exam confirms findings.  Laboratory & Radiologic Studies: 12/18/22: Pap - NIML, HR HPV negative  Assessment & Plan: Taylor Berg is a 67 y.o. 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 Surgery 11/2021. Imaging negative post-op for metastatic disease. CPS 1%.   Patient is overall doing well and is NED on exam today.     Per  NCCN surveillance guidelines, we will continue with surveillance visits every 3-6 months for 2 years.  Her preference was for visits every 6 months.  I will see her again in December.  Will plan on yearly Pap test, due again in December 2024.   We discussed signs and symptoms that would be concerning for cancer recurrence, and I have encouraged the patient to call me if she develops any of these before her next scheduled visit  20 minutes of total time was spent for this patient encounter, including preparation, face-to-face counseling with the patient and coordination of care, and documentation of the encounter.  Eugene Garnet, MD  Division of Gynecologic Oncology  Department of Obstetrics and Gynecology  [facility]

## 2023-08-18 ENCOUNTER — Ambulatory Visit
Admission: RE | Admit: 2023-08-18 | Discharge: 2023-08-18 | Disposition: A | Discharge status: Home / Self Care | Payer: Medicare Other | Source: Ambulatory Visit | Attending: Family Medicine | Admitting: Family Medicine

## 2023-08-18 ENCOUNTER — Other Ambulatory Visit: Payer: Self-pay | Admitting: Family Medicine

## 2023-08-18 DIAGNOSIS — Z1211 Encounter for screening for malignant neoplasm of colon: Secondary | ICD-10-CM

## 2023-08-18 DIAGNOSIS — Z1231 Encounter for screening mammogram for malignant neoplasm of breast: Secondary | ICD-10-CM

## 2023-08-18 DIAGNOSIS — Z0000 Encounter for general adult medical examination without abnormal findings: Secondary | ICD-10-CM

## 2023-08-31 ENCOUNTER — Telehealth: Payer: Self-pay | Admitting: Family Medicine

## 2023-08-31 ENCOUNTER — Other Ambulatory Visit: Payer: Self-pay | Admitting: Family Medicine

## 2023-08-31 DIAGNOSIS — E039 Hypothyroidism, unspecified: Secondary | ICD-10-CM

## 2023-08-31 NOTE — Telephone Encounter (Signed)
She can take every other night to see if this is beneficial.

## 2023-08-31 NOTE — Telephone Encounter (Signed)
Patient aware and verbalizes understanding. 

## 2023-09-17 ENCOUNTER — Ambulatory Visit: Payer: Medicare Other | Admitting: Family Medicine

## 2023-12-09 ENCOUNTER — Telehealth: Payer: Self-pay

## 2023-12-09 NOTE — Telephone Encounter (Signed)
Spoke with patent and changed appointment  from 3:00pm to 1:00pm. Patient confirmed and understood.

## 2023-12-10 ENCOUNTER — Inpatient Hospital Stay: Payer: Medicare Other | Attending: Gynecologic Oncology | Admitting: Gynecologic Oncology

## 2023-12-10 ENCOUNTER — Encounter: Payer: Self-pay | Admitting: Gynecologic Oncology

## 2023-12-10 ENCOUNTER — Other Ambulatory Visit (HOSPITAL_COMMUNITY)
Admission: RE | Admit: 2023-12-10 | Discharge: 2023-12-10 | Disposition: A | Discharge status: Home / Self Care | Payer: Medicare Other | Source: Ambulatory Visit | Attending: Gynecologic Oncology | Admitting: Gynecologic Oncology

## 2023-12-10 VITALS — BP 142/88 | HR 74 | Temp 99.1°F | Resp 20 | Wt 243.8 lb

## 2023-12-10 DIAGNOSIS — Z90710 Acquired absence of both cervix and uterus: Secondary | ICD-10-CM | POA: Insufficient documentation

## 2023-12-10 DIAGNOSIS — Z01411 Encounter for gynecological examination (general) (routine) with abnormal findings: Secondary | ICD-10-CM | POA: Diagnosis present

## 2023-12-10 DIAGNOSIS — Z1151 Encounter for screening for human papillomavirus (HPV): Secondary | ICD-10-CM | POA: Insufficient documentation

## 2023-12-10 DIAGNOSIS — Z8541 Personal history of malignant neoplasm of cervix uteri: Secondary | ICD-10-CM | POA: Insufficient documentation

## 2023-12-10 DIAGNOSIS — Z87891 Personal history of nicotine dependence: Secondary | ICD-10-CM | POA: Insufficient documentation

## 2023-12-10 DIAGNOSIS — C539 Malignant neoplasm of cervix uteri, unspecified: Secondary | ICD-10-CM | POA: Diagnosis not present

## 2023-12-10 NOTE — Patient Instructions (Signed)
It was good to see you today.  I do not see or feel any evidence of cancer recurrence on your exam.  I will see you for follow-up in 6 months.  As always, if you develop any new and concerning symptoms before your next visit, please call to see me sooner.   

## 2023-12-10 NOTE — Progress Notes (Signed)
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  Second of these is noted to be prominent.  No findings of definitive metastatic disease.   12/29/2021 Initial Diagnosis   Malignant neoplasm of cervix (HCC)   01/08/2022 Imaging   PET: IMPRESSION: 1. No findings to suggest metastatic disease involving the chest, abdomen or pelvis. 2. Diffuse hypermetabolism in the thyroid gland is likely due to thyroiditis. 3. No significant bony findings. 4. Stable cholelithiasis.    07/21/21: pap - [facility]IL 09/19/21: Cervical/endocervical biopsy shows H SIL (CIN-3) and superficial fragments of cervical tissue.  Comment is that due to tissue fragmentation and superficial nature of the tissue, invasive process cannot be excluded. 12/08/2021: Vaginal hysterectomy and cystoscopy with Dr. Ralph Dowdy.  Findings at the time of surgery were normal uterus and cervix. Pathology: Invasive squamous cell carcinoma, grade 2.  Tumor size 7 mm.  Depth of stromal invasion 2 mm.  Margins uninvolved by carcinoma.  Margins uninvolved by high-grade dysplasia.  LVSI not identified.  High-grade squamous intraepithelial lesion noted to involve the endocervical glands.  Endometrium with benign endometrial type polyp.  Interval History: Denies any abdominal or pelvic pain.  Denies any vaginal bleeding or discharge.  Reports baseline bowel bladder function.  Past Medical/Surgical  History: Past Medical History:  Diagnosis Date   BMI 38.0-38.9,adult    history of anemia    Hypothyroidism     Past Surgical History:  Procedure Laterality Date   APPENDECTOMY     CERVICAL CONE BIOPSY     hx of   GAS INSERTION  02/16/2012   Procedure: INSERTION OF GAS;  Surgeon: Sherrie George, MD;  Location: [facility] OR;  Service: Ophthalmology;  Laterality: Right;  C3F8   PARS PLANA VITRECTOMY W/ SCLERAL BUCKLE  02/16/2012   Procedure: PARS PLANA VITRECTOMY WITH LASER FOR MACULAR HOLE;  Surgeon: Sherrie George, MD;  Location: [facility] OR;  Service: Ophthalmology;  Laterality: Right;  25 GAUGE PPV   TONSILLECTOMY     TUBAL LIGATION     VAGINAL HYSTERECTOMY  12/08/2021    Family History  Adopted: Yes  Problem Relation Age of Onset   Stroke Mother    Thyroid disease Mother    Hypertension Father    Jaundice Father    Heart attack Father    Thyroid disease Sister    Hypertension Brother    Thyroid disease Brother    Hypertension Brother    Thyroid disease Brother    Thyroid disease Brother    Colon cancer Neg Hx    Breast cancer Neg Hx    Ovarian cancer Neg Hx    Endometrial cancer Neg Hx    Pancreatic cancer Neg Hx     Social History   Socioeconomic History   Marital status: Widowed    Spouse name: Not on file   Number of children: 3   Years of education: Not on file   Highest  education level: Not on file  Occupational History   Occupation: retired  Tobacco Use   Smoking status: Former    Current packs/day: 0.00    Types: Cigarettes    Quit date: 2007    Years since quitting: 17.9   Smokeless tobacco: Not on file   Tobacco comments:    stopped smoking 2007  Vaping Use   Vaping status: Never Used  Substance and Sexual Activity   Alcohol use: Yes    Alcohol/week: 2.0 standard drinks of alcohol    Types: 2 Shots of liquor per week   Drug use: No   Sexual activity: Not Currently  Other Topics Concern   Not on file  Social History Narrative   Patient reports  that husband passed away on 12-27-2021, has 3 sons (healthy)    One son and his wife moved in with her recently   Social Drivers of Health   Financial Resource Strain: Low Risk  (06/08/2023)   Overall Financial Resource Strain (CARDIA)    Difficulty of Paying Living Expenses: Not hard at all  Food Insecurity: No Food Insecurity (06/08/2023)   Hunger Vital Sign    Worried About Running Out of Food in the Last Year: Never true    Ran Out of Food in the Last Year: Never true  Transportation Needs: No Transportation Needs (06/08/2023)   PRAPARE - Administrator, Civil Service (Medical): No    Lack of Transportation (Non-Medical): No  Physical Activity: Insufficiently Active (06/08/2023)   Exercise Vital Sign    Days of Exercise per Week: 2 days    Minutes of Exercise per Session: 30 min  Stress: No Stress Concern Present (06/08/2023)   [employer] of Occupational Health - Occupational Stress Questionnaire    Feeling of Stress : Not at all  Social Connections: Moderately Isolated (06/08/2023)   Social Connection and Isolation Panel [NHANES]    Frequency of Communication with Friends and Family: More than three times a week    Frequency of Social Gatherings with Friends and Family: More than three times a week    Attends Religious Services: 1 to 4 times per year    Active Member of [employer] or Organizations: No    Attends Banker Meetings: Never    Marital Status: Widowed    Current Medications:  Current Outpatient Medications:    fexofenadine (ALLEGRA) 180 MG tablet, Take 180 mg by mouth daily., Disp: , Rfl:    levothyroxine (SYNTHROID) 137 MCG tablet, TAKE 1 TABLET BY MOUTH ONCE DAILY BEFORE BREAKFAST, Disp: 90 tablet, Rfl: 2   pravastatin (PRAVACHOL) 10 MG tablet, Take 1 tablet (10 mg total) by mouth daily., Disp: 90 tablet, Rfl: 3  Review of Systems: Denies appetite changes, fevers, chills, fatigue, unexplained weight changes. Denies hearing loss, neck  lumps or masses, mouth sores, ringing in ears or voice changes. Denies cough or wheezing.  Denies shortness of breath. Denies chest pain or palpitations. Denies leg swelling. Denies abdominal distention, pain, blood in stools, constipation, diarrhea, nausea, vomiting, or early satiety. Denies pain with intercourse, dysuria, frequency, hematuria or incontinence. Denies hot flashes, pelvic pain, vaginal bleeding or vaginal discharge.   Denies joint pain, back pain or muscle pain/cramps. Denies itching, rash, or wounds. Denies dizziness, headaches, numbness or seizures. Denies swollen lymph nodes or glands, denies easy bruising or bleeding. Denies anxiety, depression, confusion, or decreased concentration.  Physical Exam: BP (!) 169/89 (BP Location: Right Arm, Patient Position: Sitting)   Pulse 74  Temp 99.1 F (37.3 C) (Oral)   Resp 20   Wt 243 lb 12.8 oz (110.6 kg)   SpO2 96%   BMI 40.62 kg/m  General: Alert, oriented, no acute distress. HEENT: Traumatic, normocephalic, sclera anicteric. Chest: Clear to auscultation bilaterally.  No wheezes or rhonchi. Cardiovascular: Regular rate and rhythm, no murmurs. Abdomen: Obese, soft, nontender.  Normoactive bowel sounds.  No masses or hepatosplenomegaly appreciated.  Extremities: Grossly normal range of motion.  Warm, well perfused.  No edema bilaterally. Skin: No rashes or lesions noted. Lymphatics: No cervical, supraclavicular, or inguinal adenopathy. GU: Normal appearing external genitalia without erythema, excoriation, or lesions.  Speculum exam reveals mildly atrophic vaginal mucosa, cuff intact without lesions.  Bimanual exam reveals no masses or nodularity.  Rectovaginal exam confirms findings.  Laboratory & Radiologic Studies: None new  Assessment & Plan: Taylor Berg is a 67 y.o. 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 Surgery 11/2021. Imaging negative post-op for metastatic disease. CPS 1%.   Patient is overall  doing well and is NED on exam today.     Per NCCN surveillance guidelines, we will continue with surveillance visits every 6-12 months since she is now 2 years out from her surgery.  Her preference was for visits every 6 months for now.  Will plan on yearly Pap test, due today.   We discussed signs and symptoms that would be concerning for cancer recurrence, and I have encouraged the patient to call me if she develops any of these before her next scheduled visit.  20 minutes of total time was spent for this patient encounter, including preparation, face-to-face counseling with the patient and coordination of care, and documentation of the encounter.  Eugene Garnet, MD  Division of Gynecologic Oncology  Department of Obstetrics and Gynecology  [facility]

## 2023-12-10 NOTE — Addendum Note (Signed)
Addended by: Carver Fila on: 12/10/2023 01:59 PM   Modules accepted: Orders

## 2023-12-17 ENCOUNTER — Encounter: Payer: Self-pay | Admitting: Gynecologic Oncology

## 2023-12-17 ENCOUNTER — Encounter: Payer: Self-pay | Admitting: Family Medicine

## 2023-12-17 ENCOUNTER — Ambulatory Visit (INDEPENDENT_AMBULATORY_CARE_PROVIDER_SITE_OTHER): Payer: Medicare Other | Admitting: Family Medicine

## 2023-12-17 VITALS — BP 127/77 | HR 63 | Temp 97.3°F | Ht 64.96 in | Wt 244.0 lb

## 2023-12-17 DIAGNOSIS — E559 Vitamin D deficiency, unspecified: Secondary | ICD-10-CM

## 2023-12-17 DIAGNOSIS — E039 Hypothyroidism, unspecified: Secondary | ICD-10-CM | POA: Diagnosis not present

## 2023-12-17 DIAGNOSIS — R059 Cough, unspecified: Secondary | ICD-10-CM | POA: Diagnosis not present

## 2023-12-17 DIAGNOSIS — E7800 Pure hypercholesterolemia, unspecified: Secondary | ICD-10-CM | POA: Insufficient documentation

## 2023-12-17 LAB — CYTOLOGY - PAP
Comment: NEGATIVE
Diagnosis: NEGATIVE
High risk HPV: NEGATIVE

## 2023-12-17 MED ORDER — ALBUTEROL SULFATE HFA 108 (90 BASE) MCG/ACT IN AERS: 2.0000 | INHALATION_SPRAY | Freq: Four times a day (QID) | RESPIRATORY_TRACT | 0 refills | Status: AC | PRN

## 2023-12-17 NOTE — Progress Notes (Signed)
Subjective:  Patient ID: Taylor Berg, female    DOB: 1956/11/17, 67 y.o.   MRN: [id number]  Patient Care Team: Sonny Masters, FNP as PCP - General (Family Medicine) Silas Flood, MD as Referring Physician (Obstetrics and Gynecology) Carver Fila, MD as Consulting Physician (Gynecologic Oncology)   Chief Complaint:  Thyroid Problem (6 month follow up )   HPI: Taylor Berg is a 67 y.o. female presenting on 12/17/2023 for Thyroid Problem (6 month follow up )   Discussed the use of AI scribe software for clinical note transcription with the patient, who gave verbal consent to proceed.  History of Present Illness   The patient, with a history of allergies, thyroid disease, and arthritis, presents with a recent onset of cough and phlegm production. These symptoms occur when the patient becomes overheated, such as when wearing a coat in a warm environment. The cough persists until the patient expectorates clear phlegm, after which breathing returns to normal. There is no associated wheezing. The patient has been on fexofenadine (Allegra) for their allergies for the past two to three weeks, which seems to be controlling the allergy symptoms but not the cough.  The patient also reports knee pain, which is worse in cold weather and after sitting for extended periods. The pain is managed with extra strength Tylenol for arthritis, and the patient is trying to increase their exercise. The patient has an exercise bike at home and is considering physical therapy if the pain worsens.  Additionally, the patient experiences occasional muscle spasms on one side of their abdomen. These spasms are infrequent and resolve with rubbing.   The patient is also on Synthroid for thyroid function and pravastatin for cholesterol management. The pravastatin is suspected to contribute to the knee pain, and the patient is considering reducing the frequency of this medication. The patient denies any  changes in hunger, thirst, weight, hair, skin, or nails, and reports normal bowel habits and no cold or hot intolerance. The patient is also taking vitamin D supplements.          Relevant past medical, surgical, family, and social history reviewed and updated as indicated.  Allergies and medications reviewed and updated. Data reviewed: Chart in Epic.   Past Medical History:  Diagnosis Date   BMI 38.0-38.9,adult    history of anemia    Hypothyroidism     Past Surgical History:  Procedure Laterality Date   APPENDECTOMY     CERVICAL CONE BIOPSY     hx of   GAS INSERTION  02/16/2012   Procedure: INSERTION OF GAS;  Surgeon: Sherrie George, MD;  Location: [facility] OR;  Service: Ophthalmology;  Laterality: Right;  C3F8   PARS PLANA VITRECTOMY W/ SCLERAL BUCKLE  02/16/2012   Procedure: PARS PLANA VITRECTOMY WITH LASER FOR MACULAR HOLE;  Surgeon: Sherrie George, MD;  Location: [facility] OR;  Service: Ophthalmology;  Laterality: Right;  25 GAUGE PPV   TONSILLECTOMY     TUBAL LIGATION     VAGINAL HYSTERECTOMY  12/08/2021    Social History   Socioeconomic History   Marital status: Widowed    Spouse name: Not on file   Number of children: 3   Years of education: Not on file   Highest education level: Not on file  Occupational History   Occupation: retired  Tobacco Use   Smoking status: Former    Current packs/day: 0.00    Types: Cigarettes    Quit date: 2007  Years since quitting: 18.0   Smokeless tobacco: Not on file   Tobacco comments:    stopped smoking 2007  Vaping Use   Vaping status: Never Used  Substance and Sexual Activity   Alcohol use: Yes    Alcohol/week: 2.0 standard drinks of alcohol    Types: 2 Shots of liquor per week   Drug use: No   Sexual activity: Not Currently  Other Topics Concern   Not on file  Social History Narrative   Patient reports that husband passed away on 02-Jan-2022, has 3 sons (healthy)    One son and his wife moved in with her recently    Social Drivers of Health   Financial Resource Strain: Low Risk  (06/08/2023)   Overall Financial Resource Strain (CARDIA)    Difficulty of Paying Living Expenses: Not hard at all  Food Insecurity: No Food Insecurity (06/08/2023)   Hunger Vital Sign    Worried About Running Out of Food in the Last Year: Never true    Ran Out of Food in the Last Year: Never true  Transportation Needs: No Transportation Needs (06/08/2023)   PRAPARE - Administrator, Civil Service (Medical): No    Lack of Transportation (Non-Medical): No  Physical Activity: Insufficiently Active (06/08/2023)   Exercise Vital Sign    Days of Exercise per Week: 2 days    Minutes of Exercise per Session: 30 min  Stress: No Stress Concern Present (06/08/2023)   [employer] of Occupational Health - Occupational Stress Questionnaire    Feeling of Stress : Not at all  Social Connections: Moderately Isolated (06/08/2023)   Social Connection and Isolation Panel [NHANES]    Frequency of Communication with Friends and Family: More than three times a week    Frequency of Social Gatherings with Friends and Family: More than three times a week    Attends Religious Services: 1 to 4 times per year    Active Member of [employer] or Organizations: No    Attends Banker Meetings: Never    Marital Status: Widowed  Intimate Partner Violence: Not At Risk (06/08/2023)   Humiliation, Afraid, Rape, and Kick questionnaire    Fear of Current or Ex-Partner: No    Emotionally Abused: No    Physically Abused: No    Sexually Abused: No    Outpatient Encounter Medications as of 12/17/2023  Medication Sig   albuterol (VENTOLIN HFA) 108 (90 Base) MCG/ACT inhaler Inhale 2 puffs into the lungs every 6 (six) hours as needed for wheezing or shortness of breath.   fexofenadine (ALLEGRA) 180 MG tablet Take 180 mg by mouth daily.   levothyroxine (SYNTHROID) 137 MCG tablet TAKE 1 TABLET BY MOUTH ONCE DAILY BEFORE BREAKFAST    pravastatin (PRAVACHOL) 10 MG tablet Take 1 tablet (10 mg total) by mouth daily.   No facility-administered encounter medications on file as of 12/17/2023.    Allergies  Allergen Reactions   Penicillins Anaphylaxis   Sulfamethoxazole-Trimethoprim Hives and Rash   Iodinated Contrast Media Rash    Under breast and groin rash that developed after IV and PO contrast for CT scan in 12/2021   Latex Rash   Sulfa Drugs Cross Reactors Hives and Rash   Wound Dressing Adhesive Rash    bandaids cause skin breakout    Pertinent ROS per HPI, otherwise unremarkable      Objective:  BP 127/77 Comment: reading at home  Pulse 63   Temp (!) 97.3 F (36.3 C)  Ht 5' 4.96" (1.65 m)   Wt 244 lb (110.7 kg)   SpO2 99%   BMI 40.65 kg/m    Wt Readings from Last 3 Encounters:  12/17/23 244 lb (110.7 kg)  12/10/23 243 lb 12.8 oz (110.6 kg)  06/18/23 248 lb 9.6 oz (112.8 kg)    Physical Exam Vitals and nursing note reviewed.  Constitutional:      General: She is not in acute distress.    Appearance: Normal appearance. She is well-developed and well-groomed. She is morbidly obese. She is not ill-appearing, toxic-appearing or diaphoretic.  HENT:     Head: Normocephalic and atraumatic.     Jaw: There is normal jaw occlusion.     Right Ear: Hearing normal.     Left Ear: Hearing normal.     Nose: Nose normal.     Mouth/Throat:     Lips: Pink.     Mouth: Mucous membranes are moist.     Pharynx: Oropharynx is clear. Uvula midline.  Eyes:     General: Lids are normal.     Extraocular Movements: Extraocular movements intact.     Conjunctiva/sclera: Conjunctivae normal.     Pupils: Pupils are equal, round, and reactive to light.  Neck:     Thyroid: No thyroid mass, thyromegaly or thyroid tenderness.     Vascular: No carotid bruit or JVD.     Trachea: Trachea and phonation normal.  Cardiovascular:     Rate and Rhythm: Normal rate and regular rhythm.     Chest Wall: PMI is not displaced.      Pulses: Normal pulses.     Heart sounds: Normal heart sounds. No murmur heard.    No friction rub. No gallop.  Pulmonary:     Effort: Pulmonary effort is normal. No respiratory distress.     Breath sounds: Normal breath sounds. No wheezing.  Abdominal:     General: Bowel sounds are normal. There is no distension or abdominal bruit.     Palpations: Abdomen is soft. There is no hepatomegaly or splenomegaly.     Tenderness: There is no abdominal tenderness. There is no right CVA tenderness or left CVA tenderness.     Hernia: No hernia is present.  Musculoskeletal:        General: Normal range of motion.     Cervical back: Normal range of motion and neck supple.     Right lower leg: No edema.     Left lower leg: No edema.  Lymphadenopathy:     Cervical: No cervical adenopathy.  Skin:    General: Skin is warm and dry.     Capillary Refill: Capillary refill takes less than 2 seconds.     Coloration: Skin is not cyanotic, jaundiced or pale.     Findings: No rash.  Neurological:     General: No focal deficit present.     Mental Status: She is alert and oriented to person, place, and time.     Sensory: Sensation is intact.     Motor: Motor function is intact.     Coordination: Coordination is intact.     Gait: Gait is intact.     Deep Tendon Reflexes: Reflexes are normal and symmetric.  Psychiatric:        Attention and Perception: Attention and perception normal.        Mood and Affect: Mood and affect normal.        Speech: Speech normal.        Behavior: Behavior  normal. Behavior is cooperative.        Thought Content: Thought content normal.        Cognition and Memory: Cognition and memory normal.        Judgment: Judgment normal.      Results for orders placed or performed in visit on 12/10/23  Cytology - PAP   Collection Time: 12/10/23  1:59 PM  Result Value Ref Range   High risk HPV Negative    Adequacy Satisfactory for evaluation.    Diagnosis      - Negative for  intraepithelial lesion or malignancy (NILM)   Comment Normal Reference Range HPV - Negative        Pertinent labs & imaging results that were available during my care of the patient were reviewed by me and considered in my medical decision making.  Assessment & Plan:  Taylor "Eunice Blase" was seen today for thyroid problem.  Diagnoses and all orders for this visit:  Acquired hypothyroidism -     Thyroid Panel With TSH  Vitamin D deficiency -     CMP14+EGFR -     VITAMIN D 25 Hydroxy (Vit-D Deficiency, Fractures)  Pure hypercholesterolemia -     CMP14+EGFR -     Lipid panel  Morbid obesity (HCC) -     Thyroid Panel With TSH -     CMP14+EGFR -     Lipid panel -     VITAMIN D 25 Hydroxy (Vit-D Deficiency, Fractures)  Cough in adult -     albuterol (VENTOLIN HFA) 108 (90 Base) MCG/ACT inhaler; Inhale 2 puffs into the lungs every 6 (six) hours as needed for wheezing or shortness of breath.     Assessment and Plan    Allergic Rhinitis Intermittent cough with clear phlegm triggered by overheating, likely due to allergies. Managed with fexofenadine (Allegra), which controls allergies but not the cough. No wheezing reported. Discussed albuterol inhaler for exertion-induced symptoms. - Prescribe albuterol inhaler, 2 puffs every 4-6 hours as needed - Continue fexofenadine (Allegra)  Osteoarthritis Knee pain exacerbated by cold weather and prolonged sitting. Managed with extra strength Tylenol for arthritis. Considering physical therapy if symptoms worsen. Patient is increasing exercise, including using an exercise bike. - Continue extra strength Tylenol for arthritis as needed - Consider physical therapy if symptoms worsen - Check vitamin D levels  Hypothyroidism Well-managed on Synthroid 137 mcg. No new symptoms reported. No changes in hunger, thirst, weight, hair, skin, or nails. Normal bowel habits. No temperature intolerance. - Continue Synthroid 137 mcg - Check thyroid  function  Hyperlipidemia On pravastatin every other day, experiencing knee pain. Plan to reduce pravastatin to three times a week to see if symptoms improve. Discussed potential for physical therapy if knee pain persists. - Reduce pravastatin to three times a week (Monday, Wednesday, Friday) - Check cholesterol levels  Muscle Spasms Intermittent muscle spasms possibly related to bowel movements. No persistent or severe symptoms. Discussed starting magnesium supplement to help with muscle spasms and bowel movements. - Start magnesium supplement - Check potassium levels  General Health Maintenance Routine health maintenance discussed, including the need for a colonoscopy. Referral for colonoscopy provided. - Referral for colonoscopy - Check labs including vitamin D, cholesterol, thyroid function, and potassium  Follow-up - Follow-up if symptoms worsen or persist - Complete blood work before leaving.          Continue all other maintenance medications.  Follow up plan: Return in about 6 months (around 06/16/2024) for Annual Physical.  Continue healthy lifestyle choices, including diet (rich in fruits, vegetables, and lean proteins, and low in salt and simple carbohydrates) and exercise (at least 30 minutes of moderate physical activity daily).  Educational handout given for hypothyroidism  The above assessment and management plan was discussed with the patient. The patient verbalized understanding of and has agreed to the management plan. Patient is aware to call the clinic if they develop any new symptoms or if symptoms persist or worsen. Patient is aware when to return to the clinic for a follow-up visit. Patient educated on when it is appropriate to go to the emergency department.   Kari Baars, FNP-C [facility] [phone]

## 2023-12-18 LAB — LIPID PANEL
Chol/HDL Ratio: 3.1 {ratio} (ref 0.0–4.4)
Cholesterol, Total: 200 mg/dL — ABNORMAL HIGH (ref 100–199)
HDL: 64 mg/dL (ref >39)
LDL Chol Calc (NIH): 121 mg/dL — ABNORMAL HIGH (ref 0–99)
Triglycerides: 81 mg/dL (ref 0–149)
VLDL Cholesterol Cal: 15 mg/dL (ref 5–40)

## 2023-12-18 LAB — THYROID PANEL WITH TSH
Free Thyroxine Index: >7.2 — ABNORMAL HIGH (ref 1.2–4.9)
T3 Uptake Ratio: >56 % — ABNORMAL HIGH (ref 24–39)
T4, Total: 12.9 ug/dL — ABNORMAL HIGH (ref 4.5–12.0)
TSH: 1.25 u[IU]/mL (ref 0.450–4.500)

## 2023-12-18 LAB — CMP14+EGFR
ALT: 23 [IU]/L (ref 0–32)
AST: 25 [IU]/L (ref 0–40)
Albumin: 4.3 g/dL (ref 3.9–4.9)
Alkaline Phosphatase: 94 [IU]/L (ref 44–121)
BUN/Creatinine Ratio: 16 (ref 12–28)
BUN: 12 mg/dL (ref 8–27)
Bilirubin Total: 0.5 mg/dL (ref 0.0–1.2)
CO2: 24 mmol/L (ref 20–29)
Calcium: 9.4 mg/dL (ref 8.7–10.3)
Chloride: 105 mmol/L (ref 96–106)
Creatinine, Ser: 0.77 mg/dL (ref 0.57–1.00)
Globulin, Total: 2.9 g/dL (ref 1.5–4.5)
Glucose: 88 mg/dL (ref 70–99)
Potassium: 4.7 mmol/L (ref 3.5–5.2)
Sodium: 138 mmol/L (ref 134–144)
Total Protein: 7.2 g/dL (ref 6.0–8.5)
eGFR: 84 mL/min/{1.73_m2} (ref >59)

## 2023-12-18 LAB — VITAMIN D 25 HYDROXY (VIT D DEFICIENCY, FRACTURES): Vit D, 25-Hydroxy: 29.3 ng/mL — ABNORMAL LOW (ref 30.0–100.0)

## 2023-12-20 ENCOUNTER — Encounter (INDEPENDENT_AMBULATORY_CARE_PROVIDER_SITE_OTHER): Payer: Self-pay | Admitting: *Deleted

## 2024-05-29 ENCOUNTER — Other Ambulatory Visit: Payer: Self-pay | Admitting: Family Medicine

## 2024-05-29 DIAGNOSIS — E039 Hypothyroidism, unspecified: Secondary | ICD-10-CM

## 2024-06-08 ENCOUNTER — Ambulatory Visit: Payer: Medicare Other

## 2024-06-08 VITALS — BP 127/77 | HR 63 | Ht 64.0 in | Wt 244.0 lb

## 2024-06-08 DIAGNOSIS — Z0000 Encounter for general adult medical examination without abnormal findings: Secondary | ICD-10-CM

## 2024-06-08 DIAGNOSIS — Z1211 Encounter for screening for malignant neoplasm of colon: Secondary | ICD-10-CM

## 2024-06-08 NOTE — Patient Instructions (Signed)
 Ms. Taylor Berg , Thank you for taking time out of your busy schedule to complete your Annual Wellness Visit with me. I enjoyed our conversation and look forward to speaking with you again next year. I, as well as your care team,  appreciate your ongoing commitment to your health goals. Please review the following plan we discussed and let me know if I can assist you in the future. Your Game plan/ To Do List     Follow up Visits: Next Medicare AWV with our clinical staff: 06/11/25 at 8:40a.m.   Next Office Visit with your provider: 06/20/24 at 2:05p.m.  Clinician Recommendations:  Aim for 30 minutes of exercise or brisk walking, 6-8 glasses of water, and 5 servings of fruits and vegetables each day. Please discuss the following with your provider at your next visit: Due per awv:  Hep C/Pneumonia/Shingles vaccine; Colonoscopy--already ordered      This is a list of the screening recommended for you and due dates:  Health Maintenance  Topic Date Due   Hepatitis C Screening  Never done   Zoster (Shingles) Vaccine (1 of 2) Never done   Colon Cancer Screening  Never done   Pneumococcal Vaccine for age over 49 (1 of 1 - PCV) Never done   COVID-19 Vaccine (1) 06/24/2025*   Flu Shot  07/21/2024   Mammogram  08/17/2024   Medicare Annual Wellness Visit  06/08/2025   DTaP/Tdap/Td vaccine (2 - Td or Tdap) 06/15/2033   DEXA scan (bone density measurement)  Completed   HPV Vaccine  Aged Out   Meningitis B Vaccine  Aged Out  *Topic was postponed. The date shown is not the original due date.    Advanced directives: (Declined) Advance directive discussed with you today. Even though you declined this today, please call our office should you change your mind, and we can give you the proper paperwork for you to fill out. Advance Care Planning is important because it:  [x]  Makes sure you receive the medical care that is consistent with your values, goals, and preferences  [x]  It provides guidance to your  family and loved ones and reduces their decisional burden about whether or not they are making the right decisions based on your wishes.  Follow the link provided in your after visit summary or read over the paperwork we have mailed to you to help you started getting your Advance Directives in place. If you need assistance in completing these, please reach out to us  so that we can help you!  See attachments for Preventive Care and Fall Prevention Tips.

## 2024-06-08 NOTE — Progress Notes (Signed)
 Subjective:   Taylor Berg is a 68 y.o. who presents for a Medicare Wellness preventive visit.  As a reminder, Annual Wellness Visits don't include a physical exam, and some assessments may be limited, especially if this visit is performed virtually. We may recommend an in-person follow-up visit with your provider if needed.  Visit Complete: Virtual I connected with  KYNZI LEVAY on 06/08/24 by a audio enabled telemedicine application and verified that I am speaking with the correct person using two identifiers.  Patient Location: Home  Provider Location: Home Office  I discussed the limitations of evaluation and management by telemedicine. The patient expressed understanding and agreed to proceed.  Vital Signs: Because this visit was a virtual/telehealth visit, some criteria may be missing or patient reported. Any vitals not documented were not able to be obtained and vitals that have been documented are patient reported.  VideoDeclined- This patient declined Librarian, academic. Therefore the visit was completed with audio only.  Persons Participating in Visit: Patient.  AWV Questionnaire: No: Patient Medicare AWV questionnaire was not completed prior to this visit.  Cardiac Risk Factors include: advanced age (>5men, >89 women);obesity (BMI >30kg/m2)     Objective:    Today's Vitals   06/08/24 0925 06/08/24 0931  BP: 127/77   Pulse: 63   Weight: 244 lb (110.7 kg)   Height: 5' 4 (1.626 m)   PainSc:  4    Body mass index is 41.88 kg/m.     06/08/2024    9:41 AM 06/08/2023   11:25 AM 06/04/2022   11:20 AM 03/30/2022   11:02 AM 12/29/2021    9:24 AM 02/16/2012    9:40 AM 02/15/2012    4:25 PM  Advanced Directives  Does Patient Have a Medical Advance Directive? No No No No No  Patient does not have advance directive   Would patient like information on creating a medical advance directive?  No - Patient declined No - Patient declined No -  Patient declined Yes (MAU/Ambulatory/Procedural Areas - Information given)    Pre-existing out of facility DNR order (yellow form or pink MOST form)      No       Data saved with a previous flowsheet row definition    Current Medications (verified) Outpatient Encounter Medications as of 06/08/2024  Medication Sig   albuterol  (VENTOLIN  HFA) 108 (90 Base) MCG/ACT inhaler Inhale 2 puffs into the lungs every 6 (six) hours as needed for wheezing or shortness of breath.   Cholecalciferol (VITAMIN D3 SUPER STRENGTH) 50 MCG (2000 UT) CAPS Take by mouth.   fexofenadine (ALLEGRA) 180 MG tablet Take 180 mg by mouth daily.   levothyroxine  (SYNTHROID ) 137 MCG tablet TAKE 1 TABLET BY MOUTH ONCE DAILY BEFORE BREAKFAST   Magnesium  200 MG TABS Take by mouth.   OVER THE COUNTER MEDICATION 650 mg. Tylenol  Extra Strength-Arthritis   pravastatin  (PRAVACHOL ) 10 MG tablet Take 1 tablet (10 mg total) by mouth daily.   No facility-administered encounter medications on file as of 06/08/2024.    Allergies (verified) Penicillins, Sulfamethoxazole-trimethoprim, Iodinated contrast media, Latex, Sulfa drugs cross reactors, and Wound dressing adhesive   History: Past Medical History:  Diagnosis Date   Allergy    Sinsus   BMI 38.0-38.9,adult    history of anemia    Hypothyroidism    Past Surgical History:  Procedure Laterality Date   APPENDECTOMY     CERVICAL CONE BIOPSY     hx of   EYE  SURGERY  2013   GAS INSERTION  02/16/2012   Procedure: INSERTION OF GAS;  Surgeon: Rexene Catching, MD;  Location: [facility] OR;  Service: Ophthalmology;  Laterality: Right;  [id number]   PARS PLANA VITRECTOMY W/ SCLERAL BUCKLE  02/16/2012   Procedure: PARS PLANA VITRECTOMY WITH LASER FOR MACULAR HOLE;  Surgeon: Rexene Catching, MD;  Location: [facility] OR;  Service: Ophthalmology;  Laterality: Right;  25 GAUGE PPV   TONSILLECTOMY     TUBAL LIGATION     VAGINAL HYSTERECTOMY  12/08/2021   Family History  Adopted: Yes  Problem Relation Age of  Onset   Stroke Mother    Thyroid  disease Mother    Varicose Veins Mother    Hypertension Father    Jaundice Father    Heart attack Father    Thyroid  disease Sister    Hypertension Brother    Thyroid  disease Brother    Hypertension Brother    Thyroid  disease Brother    Thyroid  disease Brother    Colon cancer Neg Hx    Breast cancer Neg Hx    Ovarian cancer Neg Hx    Endometrial cancer Neg Hx    Pancreatic cancer Neg Hx    Social History   Socioeconomic History   Marital status: Widowed    Spouse name: Not on file   Number of children: 3   Years of education: Not on file   Highest education level: Not on file  Occupational History   Occupation: retired  Tobacco Use   Smoking status: Former    Current packs/day: 0.00    Types: Cigarettes    Quit date: 2007    Years since quitting: 18.4   Smokeless tobacco: Not on file   Tobacco comments:    stopped smoking 2007  Vaping Use   Vaping status: Never Used  Substance and Sexual Activity   Alcohol use: Yes    Alcohol/week: 2.0 standard drinks of alcohol    Types: 2 Shots of liquor per week   Drug use: No   Sexual activity: Not Currently  Other Topics Concern   Not on file  Social History Narrative   Patient reports that husband passed away on 12/22/21, has 3 sons (healthy)    One son and his wife moved in with her recently   Social Drivers of Health   Financial Resource Strain: Low Risk  (06/08/2024)   Overall Financial Resource Strain (CARDIA)    Difficulty of Paying Living Expenses: Not hard at all  Food Insecurity: No Food Insecurity (06/08/2024)   Hunger Vital Sign    Worried About Running Out of Food in the Last Year: Never true    Ran Out of Food in the Last Year: Never true  Transportation Needs: No Transportation Needs (06/08/2024)   PRAPARE - Administrator, Civil Service (Medical): No    Lack of Transportation (Non-Medical): No  Physical Activity: Insufficiently Active (06/08/2024)   Exercise  Vital Sign    Days of Exercise per Week: 3 days    Minutes of Exercise per Session: 20 min  Stress: No Stress Concern Present (06/08/2024)   [employer] of Occupational Health - Occupational Stress Questionnaire    Feeling of Stress: Not at all  Social Connections: Moderately Isolated (06/08/2024)   Social Connection and Isolation Panel    Frequency of Communication with Friends and Family: More than three times a week    Frequency of Social Gatherings with Friends and Family: More than three times  a week    Attends Religious Services: More than 4 times per year    Active Member of Clubs or Organizations: No    Attends Banker Meetings: Never    Marital Status: Widowed    Tobacco Counseling Counseling given: Yes Tobacco comments: stopped smoking 2007    Clinical Intake:  Pre-visit preparation completed: Yes  Pain : 0-10 (L-knee) Pain Score: 4  Pain Type: Chronic pain Pain Location: Knee Pain Orientation: Left Pain Descriptors / Indicators: Aching, Constant Pain Onset: Other (comment) Pain Frequency: Intermittent Pain Relieving Factors: tylenol  PRN  Pain Relieving Factors: tylenol  PRN  BMI - recorded: 41.88 Nutritional Status: BMI > 30  Obese Nutritional Risks: None Diabetes: No CBG done?: No  Lab Results  Component Value Date   HGBA1C 5.2 03/09/2023     How often do you need to have someone help you when you read instructions, pamphlets, or other written materials from your doctor or pharmacy?: 1 - Never  Interpreter Needed?: No  Information entered by :: Alia t/cma   Activities of Daily Living     06/08/2024    9:39 AM  In your present state of health, do you have any difficulty performing the following activities:  Hearing? 0  Vision? 0  Difficulty concentrating or making decisions? 0  Walking or climbing stairs? 0  Dressing or bathing? 0  Doing errands, shopping? 0  Preparing Food and eating ? N  Using the Toilet? N  In the past  six months, have you accidently leaked urine? N  Do you have problems with loss of bowel control? N  Managing your Medications? N  Managing your Finances? N  Housekeeping or managing your Housekeeping? N    Patient Care Team: Galvin Jules, FNP as PCP - General (Family Medicine) Galloway, Dionne, MD as Referring Physician (Obstetrics and Gynecology) Suzi Essex, MD as Consulting Physician (Gynecologic Oncology)  I have updated your Care Teams any recent Medical Services you may have received from other providers in the past year.     Assessment:   This is a routine wellness examination for Nabila.  Hearing/Vision screen Hearing Screening - Comments:: Pt denies hearing dif Vision Screening - Comments:: Pt wear glasses denies vision dif/pt goes in [place], [place] to upcoming appt in 7/25   Goals Addressed             This Visit's Progress    Patient Stated       Pt would like to loose wt to help w/knee pain increase physical activity       Depression Screen     06/08/2024    9:43 AM 06/06/2024    1:31 PM 12/17/2023    2:06 PM 06/16/2023   12:13 PM 06/08/2023   11:23 AM 03/09/2023    2:08 PM 09/08/2022    2:09 PM  PHQ 2/9 Scores  PHQ - 2 Score 1 0 0 0 0 0 1  PHQ- 9 Score 1  0 0  0 2    Fall Risk     06/08/2024    9:27 AM 12/17/2023    2:06 PM 06/16/2023   12:13 PM 06/08/2023   11:21 AM 03/09/2023    2:08 PM  Fall Risk   Falls in the past year? 0 1 1 1  0  Number falls in past yr: 0 0 0 1   Injury with Fall? 0 0 1 1   Risk for fall due to : No Fall Risks History of  fall(s)  History of fall(s);Impaired balance/gait;Orthopedic patient   Follow up Falls evaluation completed Falls evaluation completed Falls prevention discussed Education provided;Falls prevention discussed     MEDICARE RISK AT HOME:  Medicare Risk at Home Any stairs in or around the home?: Yes If so, are there any without handrails?: Yes Home free of loose throw rugs in walkways, pet beds,  electrical cords, etc?: Yes Adequate lighting in your home to reduce risk of falls?: Yes Life alert?: No Use of a cane, walker or w/c?: No Grab bars in the bathroom?: Yes Shower chair or bench in shower?: No Elevated toilet seat or a handicapped toilet?: Yes  TIMED UP AND GO:  Was the test performed?  no  Cognitive Function: 6CIT completed        06/08/2024    9:45 AM 06/08/2023   11:26 AM 06/04/2022   11:29 AM  6CIT Screen  What Year? 0 points 0 points 0 points  What month? 0 points 0 points 0 points  What time? 0 points 0 points 0 points  Count back from 20 0 points 0 points 0 points  Months in reverse 0 points 0 points 0 points  Repeat phrase 0 points 0 points 2 points  Total Score 0 points 0 points 2 points    Immunizations Immunization History  Administered Date(s) Administered   Tdap 06/16/2023    Screening Tests Health Maintenance  Topic Date Due   Hepatitis C Screening  Never done   Zoster Vaccines- Shingrix (1 of 2) Never done   Colonoscopy  Never done   Pneumococcal Vaccine: 50+ Years (1 of 1 - PCV) Never done   COVID-19 Vaccine (1) 06/24/2025 (Originally 01/10/1961)   INFLUENZA VACCINE  07/21/2024   MAMMOGRAM  08/17/2024   Medicare Annual Wellness (AWV)  06/08/2025   DTaP/Tdap/Td (2 - Td or Tdap) 06/15/2033   DEXA SCAN  Completed   HPV VACCINES  Aged Out   Meningococcal B Vaccine  Aged Out    Health Maintenance  Health Maintenance Due  Topic Date Due   Hepatitis C Screening  Never done   Zoster Vaccines- Shingrix (1 of 2) Never done   Colonoscopy  Never done   Pneumococcal Vaccine: 50+ Years (1 of 1 - PCV) Never done   Health Maintenance Items Addressed: See Nurse Notes at the end of this note  Additional Screening:  Vision Screening: Recommended annual ophthalmology exams for early detection of glaucoma and other disorders of the eye. Would you like a referral to an eye doctor? No    Dental Screening: Recommended annual dental exams for  proper oral hygiene  Community Resource Referral / Chronic Care Management: CRR required this visit?  No   CCM required this visit?  No   Plan:    I have personally reviewed and noted the following in the patient's chart:   Medical and social history Use of alcohol, tobacco or illicit drugs  Current medications and supplements including opioid prescriptions. Patient is not currently taking opioid prescriptions. Functional ability and status Nutritional status Physical activity Advanced directives List of other physicians Hospitalizations, surgeries, and ER visits in previous 12 months Vitals Screenings to include cognitive, depression, and falls Referrals and appointments  In addition, I have reviewed and discussed with patient certain preventive protocols, quality metrics, and best practice recommendations. A written personalized care plan for preventive services as well as general preventive health recommendations were provided to patient.   Michaelle Adolphus, [facility]   06/08/2024  After Visit Summary: (MyChart) Due to this being a telephonic visit, the after visit summary with patients personalized plan was offered to patient via MyChart   Notes: Please discuss the following with your provider at your next visit: Due per awv:  Hep C/Pneumonia/Shingles vaccine; Colonoscopy--already ordered

## 2024-06-09 ENCOUNTER — Encounter: Payer: Self-pay | Admitting: Gynecologic Oncology

## 2024-06-09 ENCOUNTER — Inpatient Hospital Stay: Payer: Medicare Other | Attending: Gynecologic Oncology | Admitting: Gynecologic Oncology

## 2024-06-09 VITALS — BP 130/82 | HR 72 | Temp 99.1°F | Resp 17 | Ht 64.0 in | Wt 239.6 lb

## 2024-06-09 DIAGNOSIS — C539 Malignant neoplasm of cervix uteri, unspecified: Secondary | ICD-10-CM

## 2024-06-09 DIAGNOSIS — Z90710 Acquired absence of both cervix and uterus: Secondary | ICD-10-CM | POA: Insufficient documentation

## 2024-06-09 DIAGNOSIS — Z8541 Personal history of malignant neoplasm of cervix uteri: Secondary | ICD-10-CM | POA: Insufficient documentation

## 2024-06-09 NOTE — Progress Notes (Signed)
 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  Second of these is noted to be prominent.  No findings of definitive metastatic disease.   12/29/2021 Initial Diagnosis   Malignant neoplasm of cervix (HCC)   01/08/2022 Imaging   PET: IMPRESSION: 1. No findings to suggest metastatic disease involving the chest, abdomen or pelvis. 2. Diffuse hypermetabolism in the thyroid  gland is likely due to thyroiditis. 3. No significant bony findings. 4. Stable cholelithiasis.    07/21/21: pap - [facility] 09/19/21: Cervical/endocervical biopsy shows H SIL (CIN-3) and superficial fragments of cervical tissue.  Comment is that due to tissue fragmentation and superficial nature of the tissue, invasive process cannot be excluded. 12/08/2021: Vaginal hysterectomy and cystoscopy with Dr. Bonny Button.  Findings at the time of surgery were normal uterus and cervix. Pathology: Invasive squamous cell carcinoma, grade 2.  Tumor size 7 mm.  Depth of stromal invasion 2 mm.  Margins uninvolved by carcinoma.  Margins uninvolved by high-grade dysplasia.  LVSI not identified.  High-grade squamous intraepithelial lesion noted to involve the endocervical glands.  Endometrium with benign endometrial type polyp.  Interval History: Doing.  Denies any vaginal bleeding or discharge.  Reports baseline bowel bladder function.  Denies abdominal pain.  Past Medical/Surgical  History: Past Medical History:  Diagnosis Date   Allergy    Sinsus   BMI 38.0-38.9,adult    history of anemia    Hypothyroidism     Past Surgical History:  Procedure Laterality Date   APPENDECTOMY     CERVICAL CONE BIOPSY     hx of   EYE SURGERY  2013   GAS INSERTION  02/16/2012   Procedure: INSERTION OF GAS;  Surgeon: Rexene Catching, MD;  Location: [facility] OR;  Service: Ophthalmology;  Laterality: Right;  [id number]   PARS PLANA VITRECTOMY W/ SCLERAL BUCKLE  02/16/2012   Procedure: PARS PLANA VITRECTOMY WITH LASER FOR MACULAR HOLE;  Surgeon: Rexene Catching, MD;  Location: [facility] OR;  Service: Ophthalmology;  Laterality: Right;  25 GAUGE PPV   TONSILLECTOMY     TUBAL LIGATION     VAGINAL HYSTERECTOMY  12/08/2021    Family History  Adopted: Yes  Problem Relation Age of Onset   Stroke Mother    Thyroid  disease Mother    Varicose Veins Mother    Hypertension Father    Jaundice Father    Heart attack Father    Thyroid  disease Sister    Hypertension Brother    Thyroid  disease Brother    Hypertension Brother    Thyroid  disease Brother    Thyroid  disease Brother    Colon cancer Neg Hx    Breast cancer Neg Hx    Ovarian cancer Neg Hx    Endometrial cancer Neg Hx    Pancreatic cancer Neg Hx     Social History   Socioeconomic History   Marital status: Widowed    Spouse name: Not on  file   Number of children: 3   Years of education: Not on file   Highest education level: Not on file  Occupational History   Occupation: retired  Tobacco Use   Smoking status: Former    Current packs/day: 0.00    Types: Cigarettes    Quit date: 2007    Years since quitting: 18.4   Smokeless tobacco: Not on file   Tobacco comments:    stopped smoking 2007  Vaping Use   Vaping status: Never Used  Substance and Sexual Activity   Alcohol use: Yes    Alcohol/week: 2.0 standard drinks of alcohol    Types: 2 Shots of liquor per week   Drug use: No   Sexual activity: Not Currently  Other Topics  Concern   Not on file  Social History Narrative   Patient reports that husband passed away on 01-03-22, has 3 sons (healthy)    One son and his wife moved in with her recently   Social Drivers of Health   Financial Resource Strain: Low Risk  (06/08/2024)   Overall Financial Resource Strain (CARDIA)    Difficulty of Paying Living Expenses: Not hard at all  Food Insecurity: No Food Insecurity (06/08/2024)   Hunger Vital Sign    Worried About Running Out of Food in the Last Year: Never true    Ran Out of Food in the Last Year: Never true  Transportation Needs: No Transportation Needs (06/08/2024)   PRAPARE - Administrator, Civil Service (Medical): No    Lack of Transportation (Non-Medical): No  Physical Activity: Insufficiently Active (06/08/2024)   Exercise Vital Sign    Days of Exercise per Week: 3 days    Minutes of Exercise per Session: 20 min  Stress: No Stress Concern Present (06/08/2024)   [employer] of Occupational Health - Occupational Stress Questionnaire    Feeling of Stress: Not at all  Social Connections: Moderately Isolated (06/08/2024)   Social Connection and Isolation Panel    Frequency of Communication with Friends and Family: More than three times a week    Frequency of Social Gatherings with Friends and Family: More than three times a week    Attends Religious Services: More than 4 times per year    Active Member of [employer] or Organizations: No    Attends Banker Meetings: Never    Marital Status: Widowed    Current Medications:  Current Outpatient Medications:    albuterol  (VENTOLIN  HFA) 108 (90 Base) MCG/ACT inhaler, Inhale 2 puffs into the lungs every 6 (six) hours as needed for wheezing or shortness of breath., Disp: 8 g, Rfl: 0   fexofenadine (ALLEGRA) 180 MG tablet, Take 180 mg by mouth daily., Disp: , Rfl:    levothyroxine  (SYNTHROID ) 137 MCG tablet, TAKE 1 TABLET BY MOUTH ONCE DAILY BEFORE BREAKFAST, Disp: 90 tablet, Rfl: 0    pravastatin  (PRAVACHOL ) 10 MG tablet, Take 1 tablet (10 mg total) by mouth daily., Disp: 90 tablet, Rfl: 3   Cholecalciferol (VITAMIN D3 SUPER STRENGTH) 50 MCG (2000 UT) CAPS, Take by mouth., Disp: , Rfl:    Magnesium  200 MG TABS, Take by mouth., Disp: , Rfl:    OVER THE COUNTER MEDICATION, 650 mg. Tylenol  Extra Strength-Arthritis, Disp: , Rfl:   Review of Systems: Denies appetite changes, fevers, chills, fatigue, unexplained weight changes. Denies hearing loss, neck lumps or masses, mouth sores, ringing in ears or voice changes. Denies cough or wheezing.  Denies shortness of breath. Denies  chest pain or palpitations. Denies leg swelling. Denies abdominal distention, pain, blood in stools, constipation, diarrhea, nausea, vomiting, or early satiety. Denies pain with intercourse, dysuria, frequency, hematuria or incontinence. Denies hot flashes, pelvic pain, vaginal bleeding or vaginal discharge.   Denies joint pain, back pain or muscle pain/cramps. Denies itching, rash, or wounds. Denies dizziness, headaches, numbness or seizures. Denies swollen lymph nodes or glands, denies easy bruising or bleeding. Denies anxiety, depression, confusion, or decreased concentration.  Physical Exam: BP (!) 141/73 (BP Location: Left Arm, Patient Position: Sitting) Comment: CMA notified  Pulse 72   Temp 99.1 F (37.3 C) (Oral)   Resp 17   Ht 5' 4 (1.626 m)   Wt 239 lb 9.6 oz (108.7 kg)   SpO2 100%   BMI 41.13 kg/m  General: Alert, oriented, no acute distress. HEENT: Traumatic, normocephalic, sclera anicteric. Chest: Clear to auscultation bilaterally.  No wheezes or rhonchi. Cardiovascular: Regular rate and rhythm, no murmurs. Abdomen: Obese, soft, nontender.  Normoactive bowel sounds.  No masses or hepatosplenomegaly appreciated.  Extremities: Grossly normal range of motion.  Warm, well perfused.  No edema bilaterally. Skin: No rashes or lesions noted. Lymphatics: No cervical, supraclavicular, or  inguinal adenopathy. GU: Normal appearing external genitalia without erythema, excoriation, or lesions.  Speculum exam reveals mildly atrophic vaginal mucosa, cuff intact without lesions.  Bimanual exam reveals no masses or nodularity.  Rectovaginal exam confirms findings.  Laboratory & Radiologic Studies: None new  Assessment & Plan: Taylor Berg is a 68 y.o. 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 Surgery 11/2021. Imaging negative post-op for metastatic disease. CPS 1%. Last pap 11/2023: NIML, HR HPV negative.   Patient is overall doing well and is NED on exam today.     Per NCCN surveillance guidelines, we will continue with surveillance visits every 6-12 months.  Her preference was to continue with visits every 6 months for now. Will plan on yearly Pap test, due in December.  We discussed signs and symptoms that would be concerning for cancer recurrence, and I have encouraged the patient to call me if she develops any of these before her next scheduled visit.  20 minutes of total time was spent for this patient encounter, including preparation, face-to-face counseling with the patient and coordination of care, and documentation of the encounter.  Wiley Hanger, MD  Division of Gynecologic Oncology  Department of Obstetrics and Gynecology  [facility]

## 2024-06-09 NOTE — Patient Instructions (Signed)
 It was good to see you today.  I do not see or feel any evidence of cancer recurrence on your exam.  I will see you for follow-up in 6 months.  As always, if you develop any new and concerning symptoms before your next visit, please call to see me sooner.

## 2024-06-20 ENCOUNTER — Encounter: Payer: Self-pay | Admitting: Family Medicine

## 2024-06-20 ENCOUNTER — Ambulatory Visit: Payer: Medicare Other | Admitting: Family Medicine

## 2024-06-20 VITALS — BP 137/72 | HR 61 | Temp 97.7°F | Ht 64.0 in | Wt 241.8 lb

## 2024-06-20 DIAGNOSIS — E039 Hypothyroidism, unspecified: Secondary | ICD-10-CM

## 2024-06-20 DIAGNOSIS — Z1211 Encounter for screening for malignant neoplasm of colon: Secondary | ICD-10-CM

## 2024-06-20 DIAGNOSIS — E559 Vitamin D deficiency, unspecified: Secondary | ICD-10-CM | POA: Diagnosis not present

## 2024-06-20 DIAGNOSIS — E782 Mixed hyperlipidemia: Secondary | ICD-10-CM | POA: Diagnosis not present

## 2024-06-20 DIAGNOSIS — Z0001 Encounter for general adult medical examination with abnormal findings: Secondary | ICD-10-CM | POA: Diagnosis not present

## 2024-06-20 DIAGNOSIS — Z0000 Encounter for general adult medical examination without abnormal findings: Secondary | ICD-10-CM | POA: Diagnosis not present

## 2024-06-20 DIAGNOSIS — R6889 Other general symptoms and signs: Secondary | ICD-10-CM | POA: Diagnosis not present

## 2024-06-20 DIAGNOSIS — Z1159 Encounter for screening for other viral diseases: Secondary | ICD-10-CM | POA: Diagnosis not present

## 2024-06-20 LAB — BAYER DCA HB A1C WAIVED: HB A1C (BAYER DCA - WAIVED): 5.3 % (ref 4.8–5.6)

## 2024-06-20 LAB — LIPID PANEL

## 2024-06-20 NOTE — Progress Notes (Signed)
 Complete physical exam  Patient: Taylor Berg   DOB: 1956/07/19   68 y.o. Female  MRN: [id number]  Subjective:    Chief Complaint  Patient presents with   Annual Exam    SCHELLY CHUBA is a 68 y.o. female who presents today for a complete physical exam. She reports consuming a general diet. She is active doing yard and garden work, no other exercise. She generally feels well. She reports sleeping well. She does not have additional problems to discuss today.   Taylor Berg is a 68 year old female who presents for an annual physical exam.  Respiratory symptoms - Discontinued Allegra due to perceived lack of necessity; resumed prior to visiting her sister, which resulted in chest tightness, leading to discontinuation again - No frequent need for albuterol  inhaler; last use approximately one month ago - No shortness of breath except when in a hurry  Gastrointestinal symptoms - No prior colonoscopy; plans to have one this year - Occasional rectal bleeding after consuming nuts, which resolves with avoidance of nuts - No constipation or diarrhea  Musculoskeletal symptoms - History of knee pain, improved since last Thursday - Reduced exercise due to knee pain - Interested in starting insurance-covered exercise programs  Hearing concerns - Family perceives hearing impairment, but attributes difficulty to others speaking at low volume  Dermatologic and nail findings - No significant changes in weight, hair, skin, or nails - Thickened, deformed toenail without discoloration; self-managed by filing; no recent podiatry evaluation  Vasomotor symptoms - Experienced a warm sensation last night, possibly related to tea consumption - No night sweats - Drinks decaffeinated beverages to avoid facial flushing  Thyroid  management - Continues levothyroxine  137 mcg daily for thyroid  disorder  Lipid management - Takes pravastatin  three times weekly without adverse effects - Hopes  cholesterol levels remain stable      Most recent fall risk assessment:    06/20/2024    2:14 PM  Fall Risk   Falls in the past year? 0  Number falls in past yr: 0  Injury with Fall? 0  Risk for fall due to : No Fall Risks  Follow up Falls evaluation completed     Most recent depression screenings:    06/20/2024    2:14 PM 06/08/2024    9:43 AM  PHQ 2/9 Scores  PHQ - 2 Score 0 1  PHQ- 9 Score 0 1    Vision:Within last year and Dental: No current dental problems and Receives regular dental care  Patient Active Problem List   Diagnosis Date Noted   Pure hypercholesterolemia 12/17/2023   Morbid obesity (HCC) 03/09/2023   Vitamin D  deficiency 03/09/2023   Acquired hypothyroidism 02/24/2022   S/P partial hysterectomy 02/24/2022   Situational mixed anxiety and depressive disorder 02/24/2022   Elevated blood pressure reading in office without diagnosis of hypertension 02/24/2022   Malignant neoplasm of cervix (HCC) 12/29/2021   Past Medical History:  Diagnosis Date   Allergy    Sinsus   BMI 38.0-38.9,adult    history of anemia    Hypothyroidism    Past Surgical History:  Procedure Laterality Date   APPENDECTOMY     CERVICAL CONE BIOPSY     hx of   EYE SURGERY  2013   GAS INSERTION  02/16/2012   Procedure: INSERTION OF GAS;  Surgeon: Norleen JONETTA Ku, MD;  Location: [facility] OR;  Service: Ophthalmology;  Laterality: Right;  C3F8   PARS PLANA VITRECTOMY W/ SCLERAL BUCKLE  02/16/2012   Procedure: PARS PLANA VITRECTOMY WITH LASER FOR MACULAR HOLE;  Surgeon: Norleen JONETTA Ku, MD;  Location: [facility] OR;  Service: Ophthalmology;  Laterality: Right;  25 GAUGE PPV   TONSILLECTOMY     TUBAL LIGATION     VAGINAL HYSTERECTOMY  12/08/2021   Social History   Tobacco Use   Smoking status: Former    Current packs/day: 0.00    Types: Cigarettes    Quit date: 2007    Years since quitting: 18.5   Tobacco comments:    stopped smoking 2007  Vaping Use   Vaping status: Never Used  Substance  Use Topics   Alcohol use: Yes    Alcohol/week: 2.0 standard drinks of alcohol    Types: 2 Shots of liquor per week   Drug use: No   Social History   Socioeconomic History   Marital status: Widowed    Spouse name: Not on file   Number of children: 3   Years of education: Not on file   Highest education level: 12th grade  Occupational History   Occupation: retired  Tobacco Use   Smoking status: Former    Current packs/day: 0.00    Types: Cigarettes    Quit date: 2007    Years since quitting: 18.5   Smokeless tobacco: Not on file   Tobacco comments:    stopped smoking 2007  Vaping Use   Vaping status: Never Used  Substance and Sexual Activity   Alcohol use: Yes    Alcohol/week: 2.0 standard drinks of alcohol    Types: 2 Shots of liquor per week   Drug use: No   Sexual activity: Not Currently  Other Topics Concern   Not on file  Social History Narrative   Patient reports that husband passed away on 30-Dec-2021, has 3 sons (healthy)    One son and his wife moved in with her recently   Social Drivers of Health   Financial Resource Strain: Low Risk  (06/19/2024)   Overall Financial Resource Strain (CARDIA)    Difficulty of Paying Living Expenses: Not hard at all  Food Insecurity: No Food Insecurity (06/19/2024)   Hunger Vital Sign    Worried About Running Out of Food in the Last Year: Never true    Ran Out of Food in the Last Year: Never true  Transportation Needs: No Transportation Needs (06/19/2024)   PRAPARE - Administrator, Civil Service (Medical): No    Lack of Transportation (Non-Medical): No  Physical Activity: Insufficiently Active (06/19/2024)   Exercise Vital Sign    Days of Exercise per Week: 2 days    Minutes of Exercise per Session: 20 min  Stress: No Stress Concern Present (06/19/2024)   [employer] of Occupational Health - Occupational Stress Questionnaire    Feeling of Stress: Not at all  Social Connections: Moderately Integrated  (06/19/2024)   Social Connection and Isolation Panel    Frequency of Communication with Friends and Family: More than three times a week    Frequency of Social Gatherings with Friends and Family: More than three times a week    Attends Religious Services: More than 4 times per year    Active Member of [employer] or Organizations: Yes    Attends Banker Meetings: More than 4 times per year    Marital Status: Widowed  Recent Concern: Social Connections - Moderately Isolated (06/08/2024)   Social Connection and Isolation Panel    Frequency of Communication with Friends and  Family: More than three times a week    Frequency of Social Gatherings with Friends and Family: More than three times a week    Attends Religious Services: More than 4 times per year    Active Member of Clubs or Organizations: No    Attends Banker Meetings: Never    Marital Status: Widowed  Intimate Partner Violence: Not At Risk (06/08/2024)   Humiliation, Afraid, Rape, and Kick questionnaire    Fear of Current or Ex-Partner: No    Emotionally Abused: No    Physically Abused: No    Sexually Abused: No   Family Status  Relation Name Status   Mother Artemisa VEAR Culver Alive   Father  Deceased   Sister  Alive   Brother 42 Alive       Patient stated something with his prostate, but dont know if it was cancer   Brother 14 Alive   Brother 68 Alive   Brother 64 Alive   Adopt Parent  Alive   Neg Hx  (Not Specified)  No partnership data on file   Family History  Adopted: Yes  Problem Relation Age of Onset   Stroke Mother    Thyroid  disease Mother    Varicose Veins Mother    Hypertension Father    Jaundice Father    Heart attack Father    Thyroid  disease Sister    Hypertension Brother    Thyroid  disease Brother    Hypertension Brother    Thyroid  disease Brother    Thyroid  disease Brother    Colon cancer Neg Hx    Breast cancer Neg Hx    Ovarian cancer Neg Hx    Endometrial cancer Neg Hx     Pancreatic cancer Neg Hx    Allergies  Allergen Reactions   Penicillins Anaphylaxis   Sulfamethoxazole-Trimethoprim Hives and Rash   Iodinated Contrast Media Rash    Under breast and groin rash that developed after IV and PO contrast for CT scan in 12/2021   Latex Rash   Sulfa Drugs Cross Reactors Hives and Rash   Wound Dressing Adhesive Rash    bandaids cause skin breakout      Patient Care Team: Severa Rock HERO, FNP as PCP - General (Family Medicine) Galloway, Dionne, MD as Referring Physician (Obstetrics and Gynecology) Viktoria Comer SAUNDERS, MD as Consulting Physician (Gynecologic Oncology)   Outpatient Medications Prior to Visit  Medication Sig   albuterol  (VENTOLIN  HFA) 108 (90 Base) MCG/ACT inhaler Inhale 2 puffs into the lungs every 6 (six) hours as needed for wheezing or shortness of breath.   Cholecalciferol (VITAMIN D3 SUPER STRENGTH) 50 MCG (2000 UT) CAPS Take by mouth.   levothyroxine  (SYNTHROID ) 137 MCG tablet TAKE 1 TABLET BY MOUTH ONCE DAILY BEFORE BREAKFAST   Magnesium  200 MG TABS Take by mouth.   OVER THE COUNTER MEDICATION 650 mg. Tylenol  Extra Strength-Arthritis   pravastatin  (PRAVACHOL ) 10 MG tablet Take 1 tablet (10 mg total) by mouth daily. (Patient taking differently: Take 10 mg by mouth daily. Take three times per week)   [DISCONTINUED] fexofenadine (ALLEGRA) 180 MG tablet Take 180 mg by mouth daily.   No facility-administered medications prior to visit.    ROS per HPI     Objective:     BP 137/72 Comment: at home this morning  Pulse 61   Temp 97.7 F (36.5 C)   Ht 5' 4 (1.626 m)   Wt 241 lb 12.8 oz (109.7 kg)   SpO2 97%  BMI 41.50 kg/m  BP Readings from Last 3 Encounters:  06/20/24 137/72  06/09/24 130/82  06/08/24 127/77   Wt Readings from Last 3 Encounters:  06/20/24 241 lb 12.8 oz (109.7 kg)  06/09/24 239 lb 9.6 oz (108.7 kg)  06/08/24 244 lb (110.7 kg)   SpO2 Readings from Last 3 Encounters:  06/20/24 97%  06/09/24 100%   12/17/23 99%      Physical Exam Vitals and nursing note reviewed.  Constitutional:      General: She is not in acute distress.    Appearance: Normal appearance. She is well-developed and well-groomed. She is morbidly obese. She is not ill-appearing, toxic-appearing or diaphoretic.  HENT:     Head: Normocephalic and atraumatic.     Jaw: There is normal jaw occlusion.     Right Ear: Hearing, tympanic membrane, ear canal and external ear normal.     Left Ear: Hearing, tympanic membrane, ear canal and external ear normal.     Ears:     Weber exam findings: Does not lateralize.    Right Rinne: AC > BC.    Left Rinne: AC > BC.    Nose: Nose normal.     Mouth/Throat:     Lips: Pink.     Mouth: Mucous membranes are moist.     Pharynx: Oropharynx is clear. Uvula midline.   Eyes:     General: Lids are normal.     Extraocular Movements: Extraocular movements intact.     Conjunctiva/sclera: Conjunctivae normal.     Pupils: Pupils are equal, round, and reactive to light.   Neck:     Thyroid : No thyroid  mass, thyromegaly or thyroid  tenderness.     Vascular: No carotid bruit or JVD.     Trachea: Trachea and phonation normal.   Cardiovascular:     Rate and Rhythm: Normal rate and regular rhythm.     Chest Wall: PMI is not displaced.     Pulses: Normal pulses.     Heart sounds: Normal heart sounds. No murmur heard.    No friction rub. No gallop.  Pulmonary:     Effort: Pulmonary effort is normal. No respiratory distress.     Breath sounds: Normal breath sounds. No wheezing.  Abdominal:     General: Bowel sounds are normal. There is no distension or abdominal bruit.     Palpations: Abdomen is soft. There is no hepatomegaly or splenomegaly.     Tenderness: There is no abdominal tenderness. There is no right CVA tenderness or left CVA tenderness.     Hernia: No hernia is present.   Musculoskeletal:        General: Normal range of motion.     Cervical back: Normal range of motion and  neck supple.     Right lower leg: No edema.     Left lower leg: No edema.  Lymphadenopathy:     Cervical: No cervical adenopathy.   Skin:    General: Skin is warm and dry.     Capillary Refill: Capillary refill takes less than 2 seconds.     Coloration: Skin is not cyanotic, jaundiced or pale.     Findings: No rash.     Comments: Multiple skin tags to neck and torso   Neurological:     General: No focal deficit present.     Mental Status: She is alert and oriented to person, place, and time.     Sensory: Sensation is intact.     Motor: Motor function  is intact.     Coordination: Coordination is intact.     Gait: Gait is intact.     Deep Tendon Reflexes: Reflexes are normal and symmetric.   Psychiatric:        Attention and Perception: Attention and perception normal.        Mood and Affect: Mood and affect normal.        Speech: Speech normal.        Behavior: Behavior normal. Behavior is cooperative.        Thought Content: Thought content normal.        Cognition and Memory: Cognition and memory normal.        Judgment: Judgment normal.       Last CBC Lab Results  Component Value Date   WBC 4.5 06/16/2023   HGB 13.4 06/16/2023   HCT 42.2 06/16/2023   MCV 92 06/16/2023   MCH 29.3 06/16/2023   RDW 13.5 06/16/2023   PLT 276 06/16/2023   Last metabolic panel Lab Results  Component Value Date   GLUCOSE 88 12/17/2023   NA 138 12/17/2023   K 4.7 12/17/2023   CL 105 12/17/2023   CO2 24 12/17/2023   BUN 12 12/17/2023   CREATININE 0.77 12/17/2023   EGFR 84 12/17/2023   CALCIUM 9.4 12/17/2023   PROT 7.2 12/17/2023   ALBUMIN 4.3 12/17/2023   LABGLOB 2.9 12/17/2023   AGRATIO 1.4 03/09/2023   BILITOT 0.5 12/17/2023   ALKPHOS 94 12/17/2023   AST 25 12/17/2023   ALT 23 12/17/2023   Last lipids Lab Results  Component Value Date   CHOL 200 (H) 12/17/2023   HDL 64 12/17/2023   LDLCALC 121 (H) 12/17/2023   TRIG 81 12/17/2023   CHOLHDL 3.1 12/17/2023   Last  hemoglobin A1c Lab Results  Component Value Date   HGBA1C 5.2 03/09/2023   Last thyroid  functions Lab Results  Component Value Date   TSH 1.250 12/17/2023   T4TOTAL 12.9 (H) 12/17/2023   Last vitamin D  Lab Results  Component Value Date   VD25OH 29.3 (L) 12/17/2023       Assessment & Plan:    Routine Health Maintenance and Physical Exam  Immunization History  Administered Date(s) Administered   Tdap 06/16/2023    Health Maintenance  Topic Date Due   Hepatitis C Screening  Never done   Colonoscopy  Never done   Zoster Vaccines- Shingrix (1 of 2) 09/20/2024 (Originally 01/10/1975)   Pneumococcal Vaccine: 50+ Years (1 of 1 - PCV) 06/20/2025 (Originally 01/10/2006)   COVID-19 Vaccine (1) 06/24/2025 (Originally 01/10/1961)   INFLUENZA VACCINE  07/21/2024   MAMMOGRAM  08/17/2024   Medicare Annual Wellness (AWV)  06/08/2025   DTaP/Tdap/Td (2 - Td or Tdap) 06/15/2033   DEXA SCAN  Completed   Hepatitis B Vaccines  Aged Out   HPV VACCINES  Aged Out   Meningococcal B Vaccine  Aged Out    Discussed health benefits of physical activity, and encouraged her to engage in regular exercise appropriate for her age and condition.  Problem List Items Addressed This Visit       Endocrine   Acquired hypothyroidism   Relevant Orders   Thyroid  Panel With TSH     Other   Morbid obesity (HCC)   Relevant Orders   CBC with Differential/Platelet   CMP14+EGFR   Lipid panel   Thyroid  Panel With TSH   Vitamin D , 25-hydroxy   Bayer DCA Hb A1c Waived   Vitamin D  deficiency  Relevant Orders   CMP14+EGFR   Vitamin D , 25-hydroxy   Other Visit Diagnoses       Annual physical exam    -  Primary   Relevant Orders   CBC with Differential/Platelet   CMP14+EGFR   Hepatitis C antibody   Ambulatory referral to Gastroenterology     Screening for colorectal cancer       Relevant Orders   Ambulatory referral to Gastroenterology     Mixed hyperlipidemia       Relevant Orders   CMP14+EGFR    Lipid panel     Need for hepatitis C screening test       Relevant Orders   Hepatitis C antibody         Colorectal Cancer Screening She has never undergone a colonoscopy and intends to have the procedure this year. Reports occasional rectal bleeding after consuming nuts, resolving with avoidance. Differential diagnosis includes diverticulosis or polyps. No other bowel movement issues reported. Screening is crucial to rule out serious conditions. - Refer for colonoscopy  Allergic Rhinitis She discontinued Allegra, resumed it before visiting her sister, and experienced chest tightness, leading to discontinuation again. Advised to use Allegra during allergy season if needed. She has albuterol  for occasional use, last used a month ago, with no shortness of breath during regular activities. - Advise to use Allegra during allergy season if needed - Continue albuterol  as needed  Hyperlipidemia She is on pravastatin  three times a week without issues. Reports dietary changes to lower cholesterol, including consuming Honey Nut Cheerios and reducing egg intake. Awaiting lab results to assess cholesterol levels. - Continue pravastatin  three times a week - Order cholesterol panel  Hypothyroidism She is on levothyroxine  137 mcg and reports well-managed thyroid  function. Awaiting lab results to confirm thyroid  levels. - Continue levothyroxine  137 mcg daily - Order thyroid  function tests  Hearing Loss She reports difficulty hearing soft or mumbling voices, particularly from her children. Informal hearing test in office was normal, indicating no conductive or sensory hearing loss. - Reassure that hearing test was normal - Advise to monitor hearing and report any worsening  Onychomycosis She has a thickened toenail with keratin buildup, but no discoloration or pain. Manages by filing the nail down. Advised to continue self-care unless condition worsens. - Advise to continue filing the nail -  Refer to podiatry if condition worsens  General Health Maintenance She is considering pneumonia and shingles vaccines but is hesitant. Provided information on the vaccines for her to review. Discussed the importance of regular eye exams due to family history of macular issues. Encouraged to engage in more physical activity, potentially through insurance-covered programs. - Provide information on pneumonia and shingles vaccines - Encourage regular eye exams - Encourage increased physical activity  Follow-up She will follow up on various health maintenance and screening activities. - Order blood work including cholesterol, vitamin, thyroid , liver, and kidney functions - Follow up on colonoscopy referral - Provide handouts on vaccines at checkout       Return in about 1 year (around 06/20/2025) for Annual Physical.     Rosaline Bruns, FNP

## 2024-06-21 ENCOUNTER — Ambulatory Visit: Payer: Self-pay | Admitting: Family Medicine

## 2024-06-21 DIAGNOSIS — E039 Hypothyroidism, unspecified: Secondary | ICD-10-CM

## 2024-06-21 LAB — THYROID PANEL WITH TSH
Free Thyroxine Index: >7.4 — AB (ref 1.2–4.9)
T3 Uptake Ratio: >56 % — ABNORMAL HIGH (ref 24–39)
T4, Total: 13.3 ug/dL — ABNORMAL HIGH (ref 4.5–12.0)
TSH: 0.04 u[IU]/mL — ABNORMAL LOW (ref 0.450–4.500)

## 2024-06-21 LAB — LIPID PANEL
Chol/HDL Ratio: 3.4 ratio (ref 0.0–4.4)
Cholesterol, Total: 193 mg/dL (ref 100–199)
HDL: 56 mg/dL (ref >39)
LDL Chol Calc (NIH): 121 mg/dL — AB (ref 0–99)
Triglycerides: 91 mg/dL (ref 0–149)
VLDL Cholesterol Cal: 16 mg/dL (ref 5–40)

## 2024-06-21 LAB — VITAMIN D 25 HYDROXY (VIT D DEFICIENCY, FRACTURES): Vit D, 25-Hydroxy: 32.8 ng/mL (ref 30.0–100.0)

## 2024-06-21 LAB — CMP14+EGFR
ALT: 19 IU/L (ref 0–32)
AST: 24 IU/L (ref 0–40)
Albumin: 4.2 g/dL (ref 3.9–4.9)
Alkaline Phosphatase: 100 IU/L (ref 44–121)
BUN/Creatinine Ratio: 23 (ref 12–28)
BUN: 17 mg/dL (ref 8–27)
Bilirubin Total: 0.4 mg/dL (ref 0.0–1.2)
CO2: 22 mmol/L (ref 20–29)
Calcium: 9.6 mg/dL (ref 8.7–10.3)
Chloride: 102 mmol/L (ref 96–106)
Creatinine, Ser: 0.75 mg/dL (ref 0.57–1.00)
Globulin, Total: 2.8 g/dL (ref 1.5–4.5)
Glucose: 81 mg/dL (ref 70–99)
Potassium: 4.4 mmol/L (ref 3.5–5.2)
Sodium: 139 mmol/L (ref 134–144)
Total Protein: 7 g/dL (ref 6.0–8.5)
eGFR: 87 mL/min/{1.73_m2} (ref >59)

## 2024-06-21 LAB — CBC WITH DIFFERENTIAL/PLATELET
Basophils Absolute: 0.1 x10E3/uL (ref 0.0–0.2)
Basos: 1 %
EOS (ABSOLUTE): 0.2 x10E3/uL (ref 0.0–0.4)
Eos: 3 %
Hematocrit: 40.6 % (ref 34.0–46.6)
Hemoglobin: 13.1 g/dL (ref 11.1–15.9)
Immature Grans (Abs): 0 x10E3/uL (ref 0.0–0.1)
Immature Granulocytes: 0 %
Lymphocytes Absolute: 1.9 x10E3/uL (ref 0.7–3.1)
Lymphs: 34 %
MCH: 29.8 pg (ref 26.6–33.0)
MCHC: 32.3 g/dL (ref 31.5–35.7)
MCV: 93 fL (ref 79–97)
Monocytes Absolute: 0.5 x10E3/uL (ref 0.1–0.9)
Monocytes: 9 %
Neutrophils Absolute: 3 10*3/uL (ref 1.4–7.0)
Neutrophils: 53 %
Platelets: 263 10*3/uL (ref 150–450)
RBC: 4.39 x10E6/uL (ref 3.77–5.28)
RDW: 13.1 % (ref 11.7–15.4)
WBC: 5.6 10*3/uL (ref 3.4–10.8)

## 2024-06-21 LAB — HEPATITIS C ANTIBODY: Hep C Virus Ab: NONREACTIVE

## 2024-06-29 DIAGNOSIS — Y929 Unspecified place or not applicable: Secondary | ICD-10-CM | POA: Diagnosis not present

## 2024-06-29 DIAGNOSIS — E039 Hypothyroidism, unspecified: Secondary | ICD-10-CM | POA: Diagnosis not present

## 2024-06-29 DIAGNOSIS — R262 Difficulty in walking, not elsewhere classified: Secondary | ICD-10-CM | POA: Diagnosis not present

## 2024-06-29 DIAGNOSIS — Y999 Unspecified external cause status: Secondary | ICD-10-CM | POA: Diagnosis not present

## 2024-06-29 DIAGNOSIS — M79672 Pain in left foot: Secondary | ICD-10-CM | POA: Diagnosis not present

## 2024-06-29 DIAGNOSIS — S93602A Unspecified sprain of left foot, initial encounter: Secondary | ICD-10-CM | POA: Diagnosis not present

## 2024-06-29 DIAGNOSIS — Y939 Activity, unspecified: Secondary | ICD-10-CM | POA: Diagnosis not present

## 2024-06-29 DIAGNOSIS — X501XXA Overexertion from prolonged static or awkward postures, initial encounter: Secondary | ICD-10-CM | POA: Diagnosis not present

## 2024-07-12 ENCOUNTER — Encounter: Payer: Self-pay | Admitting: Gastroenterology

## 2024-07-17 DIAGNOSIS — H5212 Myopia, left eye: Secondary | ICD-10-CM | POA: Diagnosis not present

## 2024-07-17 DIAGNOSIS — D3131 Benign neoplasm of right choroid: Secondary | ICD-10-CM | POA: Diagnosis not present

## 2024-07-17 DIAGNOSIS — H2512 Age-related nuclear cataract, left eye: Secondary | ICD-10-CM | POA: Diagnosis not present

## 2024-07-17 LAB — OPHTHALMOLOGY REPORT-SCANNED

## 2024-09-05 ENCOUNTER — Encounter: Payer: Self-pay | Admitting: Gastroenterology

## 2024-09-05 ENCOUNTER — Telehealth: Payer: Self-pay | Admitting: Gastroenterology

## 2024-09-05 ENCOUNTER — Ambulatory Visit: Admitting: Gastroenterology

## 2024-09-05 VITALS — BP 136/80 | HR 78 | Ht 64.0 in | Wt 241.0 lb

## 2024-09-05 DIAGNOSIS — Z8541 Personal history of malignant neoplasm of cervix uteri: Secondary | ICD-10-CM

## 2024-09-05 DIAGNOSIS — Z1211 Encounter for screening for malignant neoplasm of colon: Secondary | ICD-10-CM | POA: Insufficient documentation

## 2024-09-05 MED ORDER — NA SULFATE-K SULFATE-MG SULF 17.5-3.13-1.6 GM/177ML PO SOLN
1.0000 | Freq: Once | ORAL | 0 refills | Status: AC
Start: 2024-09-05 — End: 2024-09-05

## 2024-09-05 NOTE — Telephone Encounter (Signed)
 Patient requesting f/u call in regards to today's visit. Please advise.

## 2024-09-05 NOTE — Patient Instructions (Signed)
 You have been scheduled for a colonoscopy. Please follow written instructions given to you at your visit today.   If you use inhalers (even only as needed), please bring them with you on the day of your procedure.  DO NOT TAKE 7 DAYS PRIOR TO TEST- Trulicity (dulaglutide) Ozempic, Wegovy (semaglutide) Mounjaro (tirzepatide) Bydureon Bcise (exanatide extended release)  DO NOT TAKE 1 DAY PRIOR TO YOUR TEST Rybelsus (semaglutide) Adlyxin (lixisenatide) Victoza (liraglutide) Byetta (exanatide) ___________________________________________________________________________

## 2024-09-05 NOTE — Progress Notes (Signed)
 09/05/2024 Taylor Berg [id number] 1956/07/17   HISTORY OF PRESENT ILLNESS: This is a 68 year old female who is known to our office.  Is here today to discuss and schedule colonoscopy.  She is never had one in the past.  She had cervical cancer that required hysterectomy, but no other treatment.  Still follows with oncology every 6 months.  Has seen bright red blood on the toilet paper upon wiping on a couple of occasions.  Says she moves her bowels regularly for the most part, but the times that she saw the blood does admit that she probably had a harder stool at the time.  Hgb is normal.  No other GI complaints.  Past Medical History:  Diagnosis Date   Allergy    Sinsus   BMI 38.0-38.9,adult    history of anemia    Hypothyroidism    Past Surgical History:  Procedure Laterality Date   APPENDECTOMY     CERVICAL CONE BIOPSY     hx of   EYE SURGERY  2013   GAS INSERTION  02/16/2012   Procedure: INSERTION OF GAS;  Surgeon: Norleen JONETTA Ku, MD;  Location: [facility] OR;  Service: Ophthalmology;  Laterality: Right;  C3F8   PARS PLANA VITRECTOMY W/ SCLERAL BUCKLE  02/16/2012   Procedure: PARS PLANA VITRECTOMY WITH LASER FOR MACULAR HOLE;  Surgeon: Norleen JONETTA Ku, MD;  Location: [facility] OR;  Service: Ophthalmology;  Laterality: Right;  25 GAUGE PPV   TONSILLECTOMY     TUBAL LIGATION     VAGINAL HYSTERECTOMY  12/08/2021    reports that she quit smoking about 18 years ago. Her smoking use included cigarettes. She does not have any smokeless tobacco history on file. She reports current alcohol use of about 2.0 standard drinks of alcohol per week. She reports that she does not use drugs. family history includes Heart attack in her father; Hypertension in her brother, brother, and father; Jaundice in her father; Stroke in her mother; Thyroid  disease in her brother, brother, brother, mother, and sister; Varicose Veins in her mother. She was adopted. Allergies  Allergen Reactions   Penicillins  Anaphylaxis   Sulfamethoxazole-Trimethoprim Hives and Rash   Iodinated Contrast Media Rash    Under breast and groin rash that developed after IV and PO contrast for CT scan in 12/2021   Latex Rash   Sulfa Drugs Cross Reactors Hives and Rash   Wound Dressing Adhesive Rash    bandaids cause skin breakout      Outpatient Encounter Medications as of 09/05/2024  Medication Sig   albuterol  (VENTOLIN  HFA) 108 (90 Base) MCG/ACT inhaler Inhale 2 puffs into the lungs every 6 (six) hours as needed for wheezing or shortness of breath.   Cholecalciferol (VITAMIN D3 SUPER STRENGTH) 50 MCG (2000 UT) CAPS Take by mouth.   levothyroxine  (SYNTHROID ) 137 MCG tablet TAKE 1 TABLET BY MOUTH ONCE DAILY BEFORE BREAKFAST   Magnesium  200 MG TABS Take by mouth.   OVER THE COUNTER MEDICATION 650 mg. Tylenol  Extra Strength-Arthritis   pravastatin  (PRAVACHOL ) 10 MG tablet Take 1 tablet (10 mg total) by mouth daily. (Patient taking differently: Take 10 mg by mouth daily. Take three times per week)   No facility-administered encounter medications on file as of 09/05/2024.    REVIEW OF SYSTEMS  : All other systems reviewed and negative except where noted in the History of Present Illness.   PHYSICAL EXAM: BP 136/80   Pulse 78   Ht 5' 4 (1.626 m)  Wt 241 lb (109.3 kg)   BMI 41.37 kg/m  General: Well developed white female in no acute distress Head: Normocephalic and atraumatic Eyes:  Sclerae anicteric, conjunctiva pink. Ears: Normal auditory acuity Lungs: Clear throughout to auscultation; no W/R/R. Heart: Regular rate and rhythm; no M/R/G. Abdomen: Soft, non-distended.  BS present.  Non-tender. Rectal:  Will be done at the time of colonoscopy. Musculoskeletal: Symmetrical with no gross deformities  Skin: No lesions on visible extremities Extremities: No edema  Neurological: Alert oriented x 4, grossly non-focal Psychological:  Alert and cooperative. Normal mood and affect  ASSESSMENT AND  PLAN: *Colorectal cancer screening: Patient has never had a colonoscopy in the past.  She sess very rare bright red blood on the toilet paper following a hard stool.  Moves her bowels daily without issues for the most part.  Will schedule with Dr. Nandigam.  The risks, benefits, and alternatives to colonoscopy were discussed with the patient and she consents to proceed.  CC:  Rakes, Rock HERO, FNP

## 2024-09-05 NOTE — Telephone Encounter (Signed)
 Patient called requesting a correction within her chart. Patient states she is not adopted and would like that to be fixed. Error has been corrected in her chart.

## 2024-09-05 NOTE — Telephone Encounter (Signed)
 Left message for patient to call office.

## 2024-09-06 ENCOUNTER — Other Ambulatory Visit: Payer: Self-pay | Admitting: Family Medicine

## 2024-09-06 DIAGNOSIS — E039 Hypothyroidism, unspecified: Secondary | ICD-10-CM

## 2024-09-12 ENCOUNTER — Encounter: Payer: Self-pay | Admitting: Gastroenterology

## 2024-09-13 ENCOUNTER — Other Ambulatory Visit

## 2024-09-13 DIAGNOSIS — E039 Hypothyroidism, unspecified: Secondary | ICD-10-CM

## 2024-09-15 ENCOUNTER — Telehealth: Payer: Self-pay | Admitting: Gastroenterology

## 2024-09-15 ENCOUNTER — Telehealth: Payer: Self-pay | Admitting: Family Medicine

## 2024-09-15 NOTE — Telephone Encounter (Signed)
 ok

## 2024-09-15 NOTE — Telephone Encounter (Signed)
 Copied from CRM [id number]. Topic: Clinical - Lab/Test Results >> Sep 15, 2024 11:09 AM Myrick T wrote: Reason for CRM: patient called to get her lab results from 9/24. Please f/u with patient

## 2024-09-15 NOTE — Telephone Encounter (Signed)
 Good morning Dr. Nandigam,  I received a call from this patient stating that she has had a family emergency and will have to reschedule her procedure that was on October the 02 nd. Patient was reschedule for November the 05 th at 12:30 PM. Please advise.   Thank you.

## 2024-09-15 NOTE — Telephone Encounter (Signed)
 Pt aware her thyroid  panel has not resulted yet and we will call once labs are resulted and reviewed by provider.

## 2024-09-20 ENCOUNTER — Encounter: Admitting: Gastroenterology

## 2024-09-22 LAB — TSH+T3+FREE T4+T3 FREE
Free T-3: 2.7 pg/mL
Free T4 by Dialysis: 1.8 ng/dL — AB
TSH: 0.62 uU/mL
Triiodothyronine (T-3), Serum: 95 ng/dL

## 2024-09-24 ENCOUNTER — Ambulatory Visit: Payer: Self-pay | Admitting: Family Medicine

## 2024-09-26 NOTE — Telephone Encounter (Signed)
 Copied from CRM [id number]. Topic: Clinical - Lab/Test Results >> Sep 25, 2024  5:18 PM Tinnie C wrote: Reason for CRM: FYI- Returning call on lab results. Results relayed.

## 2024-10-25 ENCOUNTER — Encounter: Admitting: Gastroenterology

## 2024-11-08 ENCOUNTER — Encounter (INDEPENDENT_AMBULATORY_CARE_PROVIDER_SITE_OTHER): Admitting: Ophthalmology

## 2024-11-08 DIAGNOSIS — H35341 Macular cyst, hole, or pseudohole, right eye: Secondary | ICD-10-CM

## 2024-11-08 DIAGNOSIS — H43812 Vitreous degeneration, left eye: Secondary | ICD-10-CM

## 2024-11-08 DIAGNOSIS — D3131 Benign neoplasm of right choroid: Secondary | ICD-10-CM | POA: Diagnosis not present

## 2024-12-05 ENCOUNTER — Other Ambulatory Visit: Payer: Self-pay | Admitting: Family Medicine

## 2024-12-05 DIAGNOSIS — E039 Hypothyroidism, unspecified: Secondary | ICD-10-CM

## 2024-12-06 ENCOUNTER — Telehealth: Payer: Self-pay | Admitting: Gastroenterology

## 2024-12-06 NOTE — Telephone Encounter (Signed)
 Pt rescheduled colonoscopy from 12/11/2024 to 01/20/2024 and is requesting updated prep instructions. Please advise thank you.

## 2024-12-07 NOTE — Telephone Encounter (Signed)
Instructions sent via Mychart

## 2024-12-11 ENCOUNTER — Encounter: Admitting: Gastroenterology

## 2024-12-22 ENCOUNTER — Inpatient Hospital Stay: Attending: Gynecologic Oncology | Admitting: Gynecologic Oncology

## 2024-12-22 ENCOUNTER — Other Ambulatory Visit (HOSPITAL_COMMUNITY)
Admission: RE | Admit: 2024-12-22 | Discharge: 2024-12-22 | Disposition: A | Source: Ambulatory Visit | Attending: Gynecologic Oncology | Admitting: Gynecologic Oncology

## 2024-12-22 ENCOUNTER — Encounter: Payer: Self-pay | Admitting: Gynecologic Oncology

## 2024-12-22 VITALS — BP 152/88 | HR 77 | Temp 97.6°F | Resp 19 | Wt 251.6 lb

## 2024-12-22 DIAGNOSIS — Z8541 Personal history of malignant neoplasm of cervix uteri: Secondary | ICD-10-CM | POA: Insufficient documentation

## 2024-12-22 DIAGNOSIS — C539 Malignant neoplasm of cervix uteri, unspecified: Secondary | ICD-10-CM | POA: Insufficient documentation

## 2024-12-22 DIAGNOSIS — Z1151 Encounter for screening for human papillomavirus (HPV): Secondary | ICD-10-CM | POA: Diagnosis not present

## 2024-12-22 DIAGNOSIS — Z124 Encounter for screening for malignant neoplasm of cervix: Secondary | ICD-10-CM | POA: Insufficient documentation

## 2024-12-22 NOTE — Progress Notes (Signed)
 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  Second of these is noted to be prominent.  No findings of definitive metastatic disease.   12/29/2021 Initial Diagnosis   Malignant neoplasm of cervix (HCC)   01/08/2022 Imaging   PET: IMPRESSION: 1. No findings to suggest metastatic disease involving the chest, abdomen or pelvis. 2. Diffuse hypermetabolism in the thyroid  gland is likely due to thyroiditis. 3. No significant bony findings. 4. Stable cholelithiasis.    07/21/21: pap - [facility] 09/19/21: Cervical/endocervical biopsy shows H SIL (CIN-3) and superficial fragments of cervical tissue.  Comment is that due to tissue fragmentation and superficial nature of the tissue, invasive process cannot be excluded. 12/08/2021: Vaginal hysterectomy and cystoscopy with Dr. Olegario.  Findings at the time of surgery were normal uterus and cervix. Pathology: Invasive squamous cell carcinoma, grade 2.  Tumor size 7 mm.  Depth of stromal invasion 2 mm.  Margins uninvolved by carcinoma.  Margins uninvolved by high-grade dysplasia.  LVSI not identified.  High-grade squamous intraepithelial lesion noted to involve the endocervical glands.  Endometrium with benign endometrial type polyp.  Interval History: Doing well.  Denies any vaginal bleeding.  Denies pelvic or abdominal pain.  Reports normal bladder and bowel function.  Her brother-in-law passed  away in October.  Has been with her sister over the holiday and planning to have her sister move in with her this year.  Past Medical/Surgical History: Past Medical History:  Diagnosis Date   Allergy    Sinsus   BMI 38.0-38.9,adult    history of anemia    Hypothyroidism     Past Surgical History:  Procedure Laterality Date   APPENDECTOMY     CERVICAL CONE BIOPSY     hx of   EYE SURGERY  2013   GAS INSERTION  02/16/2012   Procedure: INSERTION OF GAS;  Surgeon: Norleen JONETTA Ku, MD;  Location: [facility] OR;  Service: Ophthalmology;  Laterality: Right;  [id number]   PARS PLANA VITRECTOMY W/ SCLERAL BUCKLE  02/16/2012   Procedure: PARS PLANA VITRECTOMY WITH LASER FOR MACULAR HOLE;  Surgeon: Norleen JONETTA Ku, MD;  Location: [facility] OR;  Service: Ophthalmology;  Laterality: Right;  25 GAUGE PPV   TONSILLECTOMY     TUBAL LIGATION     VAGINAL HYSTERECTOMY  12/08/2021    Family History  Problem Relation Age of Onset   Stroke Mother    Thyroid  disease Mother    Varicose Veins Mother    Hypertension Father    Jaundice Father    Heart attack Father    Thyroid  disease Sister    Hypertension Brother    Thyroid  disease Brother    Hypertension Brother    Thyroid  disease Brother    Thyroid  disease Brother    Colon cancer Neg Hx    Breast cancer Neg Hx    Ovarian cancer Neg Hx    Endometrial cancer Neg Hx  Pancreatic cancer Neg Hx     Social History   Socioeconomic History   Marital status: Widowed    Spouse name: Not on file   Number of children: 3   Years of education: Not on file   Highest education level: 12th grade  Occupational History   Occupation: retired  Tobacco Use   Smoking status: Former    Current packs/day: 0.00    Types: Cigarettes    Quit date: 2007    Years since quitting: 19.0   Smokeless tobacco: Not on file   Tobacco comments:    stopped smoking 2007  Vaping Use   Vaping status: Never Used  Substance and Sexual Activity   Alcohol use: Yes    Alcohol/week: 2.0  standard drinks of alcohol    Types: 2 Shots of liquor per week    Comment: 1 a day   Drug use: No   Sexual activity: Not Currently  Other Topics Concern   Not on file  Social History Narrative   Patient reports that husband passed away on 01/06/2022, has 3 sons (healthy)    One son and his wife moved in with her recently   Social Drivers of Health   Tobacco Use: Medium Risk (12/22/2024)   Patient History    Smoking Tobacco Use: Former    Smokeless Tobacco Use: Unknown    Passive Exposure: Not on Actuary Strain: Low Risk (06/19/2024)   Overall Financial Resource Strain (CARDIA)    Difficulty of Paying Living Expenses: Not hard at all  Food Insecurity: No Food Insecurity (06/19/2024)   Epic    Worried About Radiation Protection Practitioner of Food in the Last Year: Never true    Ran Out of Food in the Last Year: Never true  Transportation Needs: No Transportation Needs (06/19/2024)   Epic    Lack of Transportation (Medical): No    Lack of Transportation (Non-Medical): No  Physical Activity: Insufficiently Active (06/19/2024)   Exercise Vital Sign    Days of Exercise per Week: 2 days    Minutes of Exercise per Session: 20 min  Stress: No Stress Concern Present (06/19/2024)   [employer] of Occupational Health - Occupational Stress Questionnaire    Feeling of Stress: Not at all  Social Connections: Moderately Integrated (06/19/2024)   Social Connection and Isolation Panel    Frequency of Communication with Friends and Family: More than three times a week    Frequency of Social Gatherings with Friends and Family: More than three times a week    Attends Religious Services: More than 4 times per year    Active Member of [employer] or Organizations: Yes    Attends Banker Meetings: More than 4 times per year    Marital Status: Widowed  Recent Concern: Social Connections - Moderately Isolated (06/08/2024)   Social Connection and Isolation Panel    Frequency of Communication  with Friends and Family: More than three times a week    Frequency of Social Gatherings with Friends and Family: More than three times a week    Attends Religious Services: More than 4 times per year    Active Member of [employer] or Organizations: No    Attends Banker Meetings: Never    Marital Status: Widowed  Depression (PHQ2-9): Low Risk (06/20/2024)   Depression (PHQ2-9)    PHQ-2 Score: 0  Alcohol Screen: Low Risk (06/19/2024)   Alcohol Screen    Last Alcohol Screening Score (AUDIT): 1  Housing:  Low Risk (06/19/2024)   Epic    Unable to Pay for Housing in the Last Year: No    Number of Times Moved in the Last Year: 0    Homeless in the Last Year: No  Utilities: Not At Risk (06/08/2024)   Epic    Threatened with loss of utilities: No  Health Literacy: Adequate Health Literacy (06/08/2024)   B1300 Health Literacy    Frequency of need for help with medical instructions: Never    Current Medications: Current Medications[1]  Review of Systems: Denies appetite changes, fevers, chills, fatigue, unexplained weight changes. Denies hearing loss, neck lumps or masses, mouth sores, ringing in ears or voice changes. Denies cough or wheezing.  Denies shortness of breath. Denies chest pain or palpitations. Denies leg swelling. Denies abdominal distention, pain, blood in stools, constipation, diarrhea, nausea, vomiting, or early satiety. Denies pain with intercourse, dysuria, frequency, hematuria or incontinence. Denies hot flashes, pelvic pain, vaginal bleeding or vaginal discharge.   Denies joint pain, back pain or muscle pain/cramps. Denies itching, rash, or wounds. Denies dizziness, headaches, numbness or seizures. Denies swollen lymph nodes or glands, denies easy bruising or bleeding. Denies anxiety, depression, confusion, or decreased concentration.  Physical Exam: BP (!) 152/88 Comment: manual recheck, MD aware, no S&S will monitor and f/u with PCP  Pulse 77   Temp 97.6 F  (36.4 C) (Oral)   Resp 19   Wt 251 lb 9.6 oz (114.1 kg)   SpO2 100%   BMI 43.19 kg/m  General: Alert, oriented, no acute distress. HEENT: Traumatic, normocephalic, sclera anicteric. Chest: Clear to auscultation bilaterally.  No wheezes or rhonchi. Cardiovascular: Regular rate and rhythm, no murmurs. Abdomen: Obese, soft, nontender.  Normoactive bowel sounds.  No masses or hepatosplenomegaly appreciated.  Extremities: Grossly normal range of motion.  Warm, well perfused.  No edema bilaterally. Skin: No rashes or lesions noted. Lymphatics: No cervical, supraclavicular, or inguinal adenopathy. GU: Normal appearing external genitalia without erythema, excoriation, or lesions.  Speculum exam reveals mildly atrophic vaginal mucosa, cuff intact without lesions.  Bimanual exam reveals no masses or nodularity.  Rectovaginal exam confirms findings.  Laboratory & Radiologic Studies: None new  Assessment & Plan: Taylor Berg is a 69 y.o. 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 Surgery 11/2021. Imaging negative post-op for metastatic disease. CPS 1%. Last pap 11/2023: NIML, HR HPV negative.   Patient is overall doing well and is NED on exam today.     Per NCCN surveillance guidelines, we will continue with surveillance visits every 6-12 months.  Her preference was to continue with visits every 6 months for now. Will plan on yearly Pap/HPV testing, performed today.   We discussed signs and symptoms that would be concerning for cancer recurrence, and I have encouraged the patient to call me if she develops any of these before her next scheduled visit.  20 minutes of total time was spent for this patient encounter, including preparation, face-to-face counseling with the patient and coordination of care, and documentation of the encounter.  Comer Dollar, MD  Division of Gynecologic Oncology  Department of Obstetrics and Gynecology  [facility]       [1]  Current Outpatient Medications:    albuterol  (VENTOLIN  HFA) 108 (90 Base) MCG/ACT inhaler, Inhale 2 puffs into the lungs every 6 (six) hours as needed for wheezing or shortness of breath., Disp: 8 g, Rfl: 0   Cholecalciferol (VITAMIN D3 SUPER STRENGTH) 50 MCG (2000  UT) CAPS, Take by mouth., Disp: , Rfl:    levothyroxine  (SYNTHROID ) 137 MCG tablet, TAKE 1 TABLET BY MOUTH ONCE DAILY BEFORE BREAKFAST, Disp: 90 tablet, Rfl: 2   Magnesium  200 MG TABS, Take by mouth., Disp: , Rfl:    naproxen sodium (ALEVE) 220 MG tablet, Take 220 mg by mouth daily as needed., Disp: , Rfl:

## 2024-12-22 NOTE — Patient Instructions (Signed)
 It was good to see you today.  I do not see or feel any evidence of cancer recurrence on your exam.  I will see you for follow-up in 6 months.  As always, if you develop any new and concerning symptoms before your next visit, please call to see me sooner.

## 2024-12-27 ENCOUNTER — Ambulatory Visit: Payer: Self-pay | Admitting: Gynecologic Oncology

## 2024-12-27 LAB — CYTOLOGY - PAP
Adequacy: ABSENT
Comment: NEGATIVE
Diagnosis: NEGATIVE
High risk HPV: NEGATIVE

## 2025-01-11 ENCOUNTER — Telehealth: Payer: Self-pay | Admitting: Gastroenterology

## 2025-01-11 NOTE — Telephone Encounter (Signed)
 Inbound call from patient wishing to cancel 1/30 procedure due to weather and a family emergency. Patient will call back later in the year to schedule for June. Patient is aware that she will need a office visit prior to rescheduling colonoscopy.

## 2025-01-19 ENCOUNTER — Encounter: Admitting: Gastroenterology

## 2025-06-11 ENCOUNTER — Ambulatory Visit

## 2025-06-12 ENCOUNTER — Ambulatory Visit: Payer: Self-pay

## 2025-06-21 ENCOUNTER — Encounter: Payer: Self-pay | Admitting: Family Medicine

## 2025-06-29 ENCOUNTER — Inpatient Hospital Stay: Admitting: Gynecologic Oncology

## 2025-11-08 ENCOUNTER — Encounter (INDEPENDENT_AMBULATORY_CARE_PROVIDER_SITE_OTHER): Admitting: Ophthalmology
